# Patient Record
Sex: Male | Born: 1945
Health system: Southern US, Community
[De-identification: ages and names within clinical notes are randomized; demographics above are authoritative.]

## PROBLEM LIST (undated history)

## (undated) DIAGNOSIS — I10 Essential (primary) hypertension: Secondary | ICD-10-CM

## (undated) DIAGNOSIS — G4733 Obstructive sleep apnea (adult) (pediatric): Secondary | ICD-10-CM

## (undated) DIAGNOSIS — Z0000 Encounter for general adult medical examination without abnormal findings: Secondary | ICD-10-CM

## (undated) DIAGNOSIS — F339 Major depressive disorder, recurrent, unspecified: Secondary | ICD-10-CM

## (undated) DIAGNOSIS — E785 Hyperlipidemia, unspecified: Secondary | ICD-10-CM

## (undated) DIAGNOSIS — R079 Chest pain, unspecified: Secondary | ICD-10-CM

## (undated) DIAGNOSIS — L853 Xerosis cutis: Secondary | ICD-10-CM

## (undated) DIAGNOSIS — Z9989 Dependence on other enabling machines and devices: Secondary | ICD-10-CM

## (undated) DIAGNOSIS — K279 Peptic ulcer, site unspecified, unspecified as acute or chronic, without hemorrhage or perforation: Secondary | ICD-10-CM

## (undated) DIAGNOSIS — J309 Allergic rhinitis, unspecified: Secondary | ICD-10-CM

## (undated) DIAGNOSIS — I2510 Atherosclerotic heart disease of native coronary artery without angina pectoris: Secondary | ICD-10-CM

## (undated) DIAGNOSIS — Z6841 Body Mass Index (BMI) 40.0 and over, adult: Secondary | ICD-10-CM

## (undated) HISTORY — DX: Chest pain, unspecified: R07.9

## (undated) HISTORY — DX: Encounter for general adult medical examination without abnormal findings: Z00.00

## (undated) HISTORY — DX: Xerosis cutis: L85.3

## (undated) HISTORY — DX: Essential (primary) hypertension: I10

## (undated) HISTORY — DX: Peptic ulcer, site unspecified, unspecified as acute or chronic, without hemorrhage or perforation: K27.9

## (undated) HISTORY — DX: Dependence on other enabling machines and devices: Z99.89

## (undated) HISTORY — DX: Body Mass Index (BMI) 40.0 and over, adult: Z684

## (undated) HISTORY — DX: Allergic rhinitis, unspecified: J30.9

## (undated) HISTORY — DX: Obstructive sleep apnea (adult) (pediatric): G47.33

## (undated) HISTORY — DX: Major depressive disorder, recurrent, unspecified: F33.9

---

## 1999-01-27 ENCOUNTER — Ambulatory Visit (HOSPITAL_COMMUNITY): Admission: RE | Admit: 1999-01-27 | Discharge: 1999-01-27 | Payer: Self-pay | Admitting: Internal Medicine

## 2007-02-27 ENCOUNTER — Inpatient Hospital Stay (HOSPITAL_BASED_OUTPATIENT_CLINIC_OR_DEPARTMENT_OTHER): Admission: RE | Admit: 2007-02-27 | Discharge: 2007-02-27 | Payer: Self-pay | Admitting: Interventional Cardiology

## 2010-08-18 NOTE — Cardiovascular Report (Signed)
NAME:  Christopher Hernandez, ATTIG NO.:  [id number]   MEDICAL RECORD NO.:  [id number]          PATIENT TYPE:  OIB   LOCATION:  1966                         FACILITY:  [facility]   PHYSICIAN:  Corky Crafts, MDDATE OF BIRTH:  [birth year]   DATE OF PROCEDURE:  02/27/2007  DATE OF DISCHARGE:                            CARDIAC CATHETERIZATION   REFERRING PHYSICIAN:  Tally Joe, M.D.   PROCEDURES PERFORMED:  Left heart catheterization, left ventriculogram,  coronary angiogram, abdominal aortogram.   OPERATOR:  Dr. Eldridge Dace.   INDICATIONS:  Multiple risk factors for heart disease, including  diabetes, high cholesterol, high blood pressure in a patient who has  been having chest pain and an abnormal stress test.   PROCEDURE:  The risks and benefits of cardiac catheterization were  explained to the patient and informed consent was obtained.  The patient  was brought to the cath lab.  He was prepped and draped in the usual  sterile fashion.  His right groin was infiltrated with 1% lidocaine.  A  4-French arterial sheath was placed into the right femoral artery using  the modified Seldinger technique.  Left coronary artery angiography was  performed using a JL-4 catheter.  The catheter was advanced to the  vessel ostium under fluoroscopic guidance.  Digital angiography was  performed in multiple projections using hand injection of contrast.  Right coronary artery angiography was performed using a 3-D RC catheter.  Digital angiography was performed in multiple projections using hand  injection of contrast.  A  pigtail catheter was advanced to the  ascending aorta and across the aortic valve under fluoroscopic guidance.  Digital angiography was performed in the RAO projection using a power  injection of contrast.  The catheter was pulled back under continuous  hemodynamic pressure monitoring.  The catheter was then withdrawn to the  abdominal aorta, and power injection of contrast  was performed to image  the abdominal aorta.  The sheath was removed using manual compression.   FINDINGS:  The left main was a long large vessel which was widely  patent.  The left circumflex was large vessel with mild irregularities.  It gave rise to a large OM1 and a large OM2, both of which were widely  patent.  They both had mild irregularities.  There was a small ramus  branch  which was widely patent.  The left anterior descending was a  large vessel with a 25% mid-vessel stenosis.  There were mild  irregularities throughout the proximal and mid vessel.  There was a  large C1 with mild irregularities.  The right coronary artery was a  large dominant vessel with mild irregularities.  The right PDA was  medium-sized with mild irregularities.  The right posterolateral branch  was medium-sized and widely patent.  The left ventriculogram showed  normal ventricular function.  Left ventricular pressure 117/6 with an  LVEDP of 13 mmHg, aortic pressure of 117/69 with a mean aortic pressure  of 90 mmHg.  The abdominal aortogram showed no abdominal aortic  aneurysm.  There was a large SMA which was patent.  There appeared to be  single renal arteries bilaterally, both of which were widely patent   IMPRESSION:  1. Mild coronary atherosclerosis.  The patient's chest pain is likely      noncardiac in nature.  2. Normal hemodynamics.  3. Normal ventricular function.  4. No abdominal aortic aneurysm.   RECOMMENDATIONS:  The patient to continue aggressive risk factor  modification and preventive therapy.      Corky Crafts, MD  Electronically Signed     JSV/MEDQ  D:  02/27/2007  T:  02/27/2007  Job:  [id number]

## 2012-01-02 ENCOUNTER — Emergency Department (HOSPITAL_COMMUNITY)
Admission: EM | Admit: 2012-01-02 | Discharge: 2012-01-02 | Disposition: A | Discharge status: Home / Self Care | Payer: 59 | Source: Home / Self Care | Attending: Family Medicine | Admitting: Family Medicine

## 2012-01-02 ENCOUNTER — Encounter (HOSPITAL_COMMUNITY): Payer: Self-pay | Admitting: *Deleted

## 2012-01-02 DIAGNOSIS — L0291 Cutaneous abscess, unspecified: Secondary | ICD-10-CM

## 2012-01-02 DIAGNOSIS — L0390 Cellulitis, unspecified: Secondary | ICD-10-CM

## 2012-01-02 HISTORY — DX: Hyperlipidemia, unspecified: E78.5

## 2012-01-02 MED ORDER — CEPHALEXIN 500 MG PO CAPS: 500.0000 mg | ORAL_CAPSULE | Freq: Four times a day (QID) | ORAL | Status: DC

## 2012-01-02 NOTE — ED Provider Notes (Signed)
History     CSN: [id number]  Arrival date & time 01/02/12  4098   First MD Initiated Contact with Patient 01/02/12 1937      Chief Complaint  Patient presents with  . Cellulitis    (Consider location/radiation/quality/duration/timing/severity/associated sxs/prior treatment) HPI Comments: Pt thinks a mosquito bit him about a week ago on L forearm. Area has gradually gotten to be painful, warm, red, worse in last 2 days. Blood sugars are typically 80-125.   Patient is a 66 y.o. male presenting with extremity pain. The history is provided by the patient.  Extremity Pain This is a new problem. Episode onset: 1 week ago, worse in last 2 days. The problem occurs constantly. The problem has been gradually worsening. The symptoms are aggravated by bending. Nothing relieves the symptoms. He has tried nothing for the symptoms.    Past Medical History  Diagnosis Date  . Diabetes mellitus   . Hyperlipemia   . Depression     History reviewed. No pertinent past surgical history.  Family History  Problem Relation Age of Onset  . Family history unknown: Yes    History  Substance Use Topics  . Smoking status: Never Smoker   . Smokeless tobacco: Not on file  . Alcohol Use: No      Review of Systems  Constitutional: Negative for fever and chills.  Musculoskeletal: Negative for joint swelling.  Skin: Positive for color change.    Allergies  Review of patient's allergies indicates no known allergies.  Home Medications   Current Outpatient Rx  Name Route Sig Dispense Refill  . ASPIRIN 81 MG PO TABS Oral Take 81 mg by mouth daily.    Marland Kitchen LISINOPRIL 10 MG PO TABS Oral Take 10 mg by mouth daily.    Marland Kitchen METFORMIN HCL 1000 MG PO TABS Oral Take 1,000 mg by mouth 2 (two) times daily with a meal.    . PIOGLITAZONE HCL 15 MG PO TABS Oral Take 15 mg by mouth daily.    . SERTRALINE HCL 100 MG PO TABS Oral Take 100 mg by mouth daily.    Marland Kitchen SIMVASTATIN 40 MG PO TABS Oral Take 40 mg by mouth  every evening.    . CEPHALEXIN 500 MG PO CAPS Oral Take 1 capsule (500 mg total) by mouth 4 (four) times daily. 40 capsule 0    BP 146/90  Pulse 86  Temp 97.5 F (36.4 C) (Oral)  Resp 18  SpO2 94%  Physical Exam  Constitutional: He appears well-developed and well-nourished. No distress.  Pulmonary/Chest: Effort normal.  Musculoskeletal:       Left elbow: He exhibits normal range of motion, no swelling and no deformity. no tenderness found.  Skin: Skin is warm and dry. No rash noted. There is erythema.       ED Course  Procedures (including critical care time)  Labs Reviewed - No data to display No results found.   1. Cellulitis       MDM  Dr. Artis Flock examined pt with me and will see pt tomorrow for recheck.         Cathlyn Parsons, NP 01/02/12 1958

## 2012-01-02 NOTE — ED Notes (Signed)
pt treports redness and swelling to left elbow that has gotten worse over the past week

## 2012-01-03 ENCOUNTER — Encounter (HOSPITAL_COMMUNITY): Payer: Self-pay | Admitting: Emergency Medicine

## 2012-01-03 ENCOUNTER — Emergency Department (HOSPITAL_COMMUNITY)
Admission: EM | Admit: 2012-01-03 | Discharge: 2012-01-03 | Disposition: A | Discharge status: Home / Self Care | Payer: 59 | Source: Home / Self Care

## 2012-01-03 DIAGNOSIS — L0390 Cellulitis, unspecified: Secondary | ICD-10-CM

## 2012-01-03 DIAGNOSIS — L0291 Cutaneous abscess, unspecified: Secondary | ICD-10-CM

## 2012-01-03 NOTE — ED Provider Notes (Signed)
History     CSN: [id number]  Arrival date & time 01/03/12  1823   None     Chief Complaint  Patient presents with  . Follow-up    (Consider location/radiation/quality/duration/timing/severity/associated sxs/prior treatment) HPI Comments: C72-year-old man seen yesterday for cellulitis of the left arm returns today for wound check. Been taking his antibiotics as directed. He states his arm feels better to him, much less tight and not quite as red.   Past Medical History  Diagnosis Date  . Diabetes mellitus   . Hyperlipemia   . Depression     History reviewed. No pertinent past surgical history.  History reviewed. No pertinent family history.  History  Substance Use Topics  . Smoking status: Never Smoker   . Smokeless tobacco: Not on file  . Alcohol Use: No      Review of Systems  Constitutional: Negative.   Skin: Positive for color change.       Erythema to posterior upper arm. Elbow proximalmost forearm. There is edema however it is less than yesterday.    Allergies  Review of patient's allergies indicates no known allergies.  Home Medications   Current Outpatient Rx  Name Route Sig Dispense Refill  . ASPIRIN 81 MG PO TABS Oral Take 81 mg by mouth daily.    . CEPHALEXIN 500 MG PO CAPS Oral Take 1 capsule (500 mg total) by mouth 4 (four) times daily. 40 capsule 0  . LISINOPRIL 10 MG PO TABS Oral Take 10 mg by mouth daily.    Marland Kitchen METFORMIN HCL 1000 MG PO TABS Oral Take 1,000 mg by mouth 2 (two) times daily with a meal.    . PIOGLITAZONE HCL 15 MG PO TABS Oral Take 15 mg by mouth daily.    . SERTRALINE HCL 100 MG PO TABS Oral Take 100 mg by mouth daily.    Marland Kitchen SIMVASTATIN 40 MG PO TABS Oral Take 40 mg by mouth every evening.      BP 138/70  Pulse 72  Temp 98.2 F (36.8 C) (Oral)  Resp 18  SpO2 97%  Physical Exam  Constitutional: He is oriented to person, place, and time. He appears well-developed and well-nourished. No distress.  Neurological: He is alert  and oriented to person, place, and time.  Skin: Skin is warm and dry. There is erythema.       Erythema to posterior upper arm. Elbow proximalmost forearm. There is edema however it is less than yesterday.     ED Course  Procedures (including critical care time)  Labs Reviewed - No data to display No results found.   1. Cellulitis       MDM  Seen by Dr. Artis Flock. St is improved over yesterday. To continue the antibx. Return if worse.         Hayden Rasmussen, NP 01/03/12 2012

## 2012-01-03 NOTE — ED Provider Notes (Signed)
Medical screening examination/treatment/procedure(s) were performed by resident physician or non-physician practitioner and as supervising physician I was immediately available for consultation/collaboration.   Barkley Bruns MD.    Linna Hoff, MD 01/03/12 [phone]

## 2012-01-03 NOTE — ED Provider Notes (Signed)
Medical screening examination/treatment/procedure(s) were performed by resident physician or non-physician practitioner and as supervising physician I was immediately available for consultation/collaboration.   Barkley Bruns MD.    Linna Hoff, MD 01/03/12 2122

## 2012-01-03 NOTE — ED Notes (Addendum)
Follow-up left arm cellulitis. Still has some redness and swelling around elbow.  Pt states that it is feeling a lot better. pt denies pain.

## 2014-10-31 ENCOUNTER — Ambulatory Visit: Payer: Medicare HMO | Admitting: Interventional Cardiology

## 2015-01-10 DIAGNOSIS — G4733 Obstructive sleep apnea (adult) (pediatric): Secondary | ICD-10-CM | POA: Diagnosis not present

## 2015-02-10 DIAGNOSIS — G4733 Obstructive sleep apnea (adult) (pediatric): Secondary | ICD-10-CM | POA: Diagnosis not present

## 2015-03-12 DIAGNOSIS — G4733 Obstructive sleep apnea (adult) (pediatric): Secondary | ICD-10-CM | POA: Diagnosis not present

## 2015-04-12 DIAGNOSIS — G4733 Obstructive sleep apnea (adult) (pediatric): Secondary | ICD-10-CM | POA: Diagnosis not present

## 2015-04-17 DIAGNOSIS — G4733 Obstructive sleep apnea (adult) (pediatric): Secondary | ICD-10-CM | POA: Diagnosis not present

## 2015-05-12 DIAGNOSIS — E119 Type 2 diabetes mellitus without complications: Secondary | ICD-10-CM | POA: Diagnosis not present

## 2015-05-12 DIAGNOSIS — J309 Allergic rhinitis, unspecified: Secondary | ICD-10-CM | POA: Diagnosis not present

## 2015-05-12 DIAGNOSIS — Z7984 Long term (current) use of oral hypoglycemic drugs: Secondary | ICD-10-CM | POA: Diagnosis not present

## 2015-05-12 DIAGNOSIS — E782 Mixed hyperlipidemia: Secondary | ICD-10-CM | POA: Diagnosis not present

## 2015-05-12 DIAGNOSIS — G4730 Sleep apnea, unspecified: Secondary | ICD-10-CM | POA: Diagnosis not present

## 2015-05-12 DIAGNOSIS — K029 Dental caries, unspecified: Secondary | ICD-10-CM | POA: Diagnosis not present

## 2015-05-12 DIAGNOSIS — R69 Illness, unspecified: Secondary | ICD-10-CM | POA: Diagnosis not present

## 2015-05-12 DIAGNOSIS — Z6841 Body Mass Index (BMI) 40.0 and over, adult: Secondary | ICD-10-CM | POA: Diagnosis not present

## 2015-05-13 DIAGNOSIS — G4733 Obstructive sleep apnea (adult) (pediatric): Secondary | ICD-10-CM | POA: Diagnosis not present

## 2015-05-16 DIAGNOSIS — Z1211 Encounter for screening for malignant neoplasm of colon: Secondary | ICD-10-CM | POA: Diagnosis not present

## 2015-06-10 DIAGNOSIS — G4733 Obstructive sleep apnea (adult) (pediatric): Secondary | ICD-10-CM | POA: Diagnosis not present

## 2015-06-17 DIAGNOSIS — J069 Acute upper respiratory infection, unspecified: Secondary | ICD-10-CM | POA: Diagnosis not present

## 2015-07-11 DIAGNOSIS — G4733 Obstructive sleep apnea (adult) (pediatric): Secondary | ICD-10-CM | POA: Diagnosis not present

## 2015-08-10 DIAGNOSIS — G4733 Obstructive sleep apnea (adult) (pediatric): Secondary | ICD-10-CM | POA: Diagnosis not present

## 2015-09-03 DIAGNOSIS — G4733 Obstructive sleep apnea (adult) (pediatric): Secondary | ICD-10-CM | POA: Diagnosis not present

## 2015-09-10 DIAGNOSIS — G4733 Obstructive sleep apnea (adult) (pediatric): Secondary | ICD-10-CM | POA: Diagnosis not present

## 2015-10-10 DIAGNOSIS — G4733 Obstructive sleep apnea (adult) (pediatric): Secondary | ICD-10-CM | POA: Diagnosis not present

## 2015-11-06 DIAGNOSIS — J0100 Acute maxillary sinusitis, unspecified: Secondary | ICD-10-CM | POA: Diagnosis not present

## 2015-11-10 DIAGNOSIS — G4733 Obstructive sleep apnea (adult) (pediatric): Secondary | ICD-10-CM | POA: Diagnosis not present

## 2015-12-02 DIAGNOSIS — Z125 Encounter for screening for malignant neoplasm of prostate: Secondary | ICD-10-CM | POA: Diagnosis not present

## 2015-12-02 DIAGNOSIS — R69 Illness, unspecified: Secondary | ICD-10-CM | POA: Diagnosis not present

## 2015-12-02 DIAGNOSIS — Z0000 Encounter for general adult medical examination without abnormal findings: Secondary | ICD-10-CM | POA: Diagnosis not present

## 2015-12-02 DIAGNOSIS — J309 Allergic rhinitis, unspecified: Secondary | ICD-10-CM | POA: Diagnosis not present

## 2015-12-02 DIAGNOSIS — Z1211 Encounter for screening for malignant neoplasm of colon: Secondary | ICD-10-CM | POA: Diagnosis not present

## 2015-12-02 DIAGNOSIS — E119 Type 2 diabetes mellitus without complications: Secondary | ICD-10-CM | POA: Diagnosis not present

## 2015-12-02 DIAGNOSIS — E782 Mixed hyperlipidemia: Secondary | ICD-10-CM | POA: Diagnosis not present

## 2015-12-02 DIAGNOSIS — Z1389 Encounter for screening for other disorder: Secondary | ICD-10-CM | POA: Diagnosis not present

## 2015-12-02 DIAGNOSIS — G4730 Sleep apnea, unspecified: Secondary | ICD-10-CM | POA: Diagnosis not present

## 2015-12-15 DIAGNOSIS — J209 Acute bronchitis, unspecified: Secondary | ICD-10-CM | POA: Diagnosis not present

## 2016-03-17 DIAGNOSIS — E119 Type 2 diabetes mellitus without complications: Secondary | ICD-10-CM | POA: Diagnosis not present

## 2016-03-17 DIAGNOSIS — E7800 Pure hypercholesterolemia, unspecified: Secondary | ICD-10-CM | POA: Diagnosis not present

## 2016-03-17 DIAGNOSIS — I10 Essential (primary) hypertension: Secondary | ICD-10-CM | POA: Diagnosis not present

## 2016-03-17 DIAGNOSIS — R69 Illness, unspecified: Secondary | ICD-10-CM | POA: Diagnosis not present

## 2016-03-17 DIAGNOSIS — Z6839 Body mass index (BMI) 39.0-39.9, adult: Secondary | ICD-10-CM | POA: Diagnosis not present

## 2016-03-17 DIAGNOSIS — Z0000 Encounter for general adult medical examination without abnormal findings: Secondary | ICD-10-CM | POA: Diagnosis not present

## 2016-06-04 DIAGNOSIS — Z7984 Long term (current) use of oral hypoglycemic drugs: Secondary | ICD-10-CM | POA: Diagnosis not present

## 2016-06-04 DIAGNOSIS — E1165 Type 2 diabetes mellitus with hyperglycemia: Secondary | ICD-10-CM | POA: Diagnosis not present

## 2016-06-04 DIAGNOSIS — J309 Allergic rhinitis, unspecified: Secondary | ICD-10-CM | POA: Diagnosis not present

## 2016-06-04 DIAGNOSIS — E782 Mixed hyperlipidemia: Secondary | ICD-10-CM | POA: Diagnosis not present

## 2016-06-04 DIAGNOSIS — G4730 Sleep apnea, unspecified: Secondary | ICD-10-CM | POA: Diagnosis not present

## 2016-06-04 DIAGNOSIS — R69 Illness, unspecified: Secondary | ICD-10-CM | POA: Diagnosis not present

## 2016-11-24 DIAGNOSIS — G4733 Obstructive sleep apnea (adult) (pediatric): Secondary | ICD-10-CM | POA: Diagnosis not present

## 2016-12-27 DIAGNOSIS — G4733 Obstructive sleep apnea (adult) (pediatric): Secondary | ICD-10-CM | POA: Diagnosis not present

## 2016-12-27 DIAGNOSIS — H5203 Hypermetropia, bilateral: Secondary | ICD-10-CM | POA: Diagnosis not present

## 2017-02-03 DIAGNOSIS — E1165 Type 2 diabetes mellitus with hyperglycemia: Secondary | ICD-10-CM | POA: Diagnosis not present

## 2017-02-03 DIAGNOSIS — L853 Xerosis cutis: Secondary | ICD-10-CM | POA: Diagnosis not present

## 2017-02-03 DIAGNOSIS — E782 Mixed hyperlipidemia: Secondary | ICD-10-CM | POA: Diagnosis not present

## 2017-02-03 DIAGNOSIS — R69 Illness, unspecified: Secondary | ICD-10-CM | POA: Diagnosis not present

## 2017-02-03 DIAGNOSIS — J309 Allergic rhinitis, unspecified: Secondary | ICD-10-CM | POA: Diagnosis not present

## 2017-02-03 DIAGNOSIS — Z0000 Encounter for general adult medical examination without abnormal findings: Secondary | ICD-10-CM | POA: Diagnosis not present

## 2017-02-03 DIAGNOSIS — Z7984 Long term (current) use of oral hypoglycemic drugs: Secondary | ICD-10-CM | POA: Diagnosis not present

## 2017-02-03 DIAGNOSIS — R079 Chest pain, unspecified: Secondary | ICD-10-CM | POA: Diagnosis not present

## 2017-02-03 DIAGNOSIS — G4730 Sleep apnea, unspecified: Secondary | ICD-10-CM | POA: Diagnosis not present

## 2017-02-28 DIAGNOSIS — E119 Type 2 diabetes mellitus without complications: Secondary | ICD-10-CM | POA: Diagnosis not present

## 2017-03-16 ENCOUNTER — Encounter: Payer: Self-pay | Admitting: *Deleted

## 2017-03-21 ENCOUNTER — Encounter: Payer: Self-pay | Admitting: Interventional Cardiology

## 2017-03-21 ENCOUNTER — Ambulatory Visit: Payer: Medicare HMO | Admitting: Interventional Cardiology

## 2017-03-21 VITALS — BP 110/74 | HR 79 | Ht 69.0 in | Wt 277.6 lb

## 2017-03-21 DIAGNOSIS — I209 Angina pectoris, unspecified: Secondary | ICD-10-CM

## 2017-03-21 DIAGNOSIS — E118 Type 2 diabetes mellitus with unspecified complications: Secondary | ICD-10-CM

## 2017-03-21 DIAGNOSIS — E785 Hyperlipidemia, unspecified: Secondary | ICD-10-CM

## 2017-03-21 DIAGNOSIS — R0609 Other forms of dyspnea: Secondary | ICD-10-CM

## 2017-03-21 DIAGNOSIS — R0600 Dyspnea, unspecified: Secondary | ICD-10-CM

## 2017-03-21 LAB — BASIC METABOLIC PANEL
BUN/Creatinine Ratio: 18 (ref 10–24)
BUN: 17 mg/dL (ref 8–27)
CALCIUM: 9.3 mg/dL (ref 8.6–10.2)
CO2: 24 mmol/L (ref 20–29)
CREATININE: 0.95 mg/dL (ref 0.76–1.27)
Chloride: 104 mmol/L (ref 96–106)
GFR calc Af Amer: 93 mL/min/{1.73_m2} (ref >59)
GFR, EST NON AFRICAN AMERICAN: 80 mL/min/{1.73_m2} (ref >59)
Glucose: 125 mg/dL — ABNORMAL HIGH (ref 65–99)
Potassium: 4.7 mmol/L (ref 3.5–5.2)
Sodium: 141 mmol/L (ref 134–144)

## 2017-03-21 LAB — CBC
HEMOGLOBIN: 13.7 g/dL (ref 13.0–17.7)
Hematocrit: 41.3 % (ref 37.5–51.0)
MCH: 30.2 pg (ref 26.6–33.0)
MCHC: 33.2 g/dL (ref 31.5–35.7)
MCV: 91 fL (ref 79–97)
Platelets: 277 10*3/uL (ref 150–379)
RBC: 4.54 x10E6/uL (ref 4.14–5.80)
RDW: 13.2 % (ref 12.3–15.4)
WBC: 6.8 10*3/uL (ref 3.4–10.8)

## 2017-03-21 LAB — PROTIME-INR
INR: 1 (ref 0.8–1.2)
Prothrombin Time: 10.4 s (ref 9.1–12.0)

## 2017-03-21 NOTE — Progress Notes (Signed)
Cardiology Office Note   Date:  03/21/2017   ID:  GRIFFEY NICASIO, DOB November 02, 1945, MRN [id number]  PCP:  [name], MD    No chief complaint on file. CAD   Wt Readings from Last 3 Encounters:  03/21/17 277 lb 9.6 oz (125.9 kg)       History of Present Illness: Christopher Hernandez is a 71 y.o. male  Who has had RF for CAD.  He reports some DOE, fatigue and tightness in his chest.  Triggers for these include activity, particularly going up stairs.  Emotional stress can also play a part.  Rest improves the sx.  He wants to sleep all of the time.  He uses CPAP.  No regular exercise.      Past Medical History:  Diagnosis Date  . BMI 40.0-44.9, adult ([place])   . Chest pain at rest   . Diabetes mellitus   . Hyperlipemia   . Hypertension   . OSA on CPAP   . Peptic ulcer disease   . Recurrent major depressive disorder ([place])   . Rhinitis, allergic   . Routine general medical examination at a health care facility   . Xerosis of skin     History reviewed. No pertinent surgical history.   Current Outpatient Medications  Medication Sig Dispense Refill  . aspirin 81 MG tablet Take 81 mg by mouth daily.    . Beclomethasone Dipropionate (QNASL) 80 MCG/ACT AERS Place 2 puffs into both nostrils daily.    . fluticasone (FLONASE) 50 MCG/ACT nasal spray Place into both nostrils daily.    Marland Kitchen lisinopril (PRINIVIL,ZESTRIL) 10 MG tablet Take 10 mg by mouth daily.    . metFORMIN (GLUCOPHAGE) 1000 MG tablet Take 1,000 mg by mouth 2 (two) times daily with a meal.    . pioglitazone (ACTOS) 15 MG tablet Take 15 mg by mouth daily.    . sertraline (ZOLOFT) 100 MG tablet Take 100 mg by mouth daily.    . simvastatin (ZOCOR) 40 MG tablet Take 40 mg by mouth every evening.     No current facility-administered medications for this visit.     Allergies:   Patient has no known allergies.    Social History:  The patient  reports that  has never smoked. he has never used smokeless tobacco. He  reports that he does not drink alcohol or use drugs.   Family History:  The patient's family history includes Cirrhosis in his father; Diabetes Mellitus I in his mother; Hypertension in his mother; Liver disease in his father.    ROS:  Please see the history of present illness.   Otherwise, review of systems are positive for DOE.   All other systems are reviewed and negative.    PHYSICAL EXAM: VS:  BP 110/74   Pulse 79   Ht 5\' 9"  (1.753 m)   Wt 277 lb 9.6 oz (125.9 kg)   SpO2 95%   BMI 40.99 kg/m  , BMI Body mass index is 40.99 kg/m. GEN: Well nourished, well developed, in no acute distress  HEENT: normal  Neck: no JVD, carotid bruits, or masses Cardiac: RRR; no murmurs, rubs, or gallops,no edema  Respiratory:  clear to auscultation bilaterally, normal work of breathing GI: soft, nontender, nondistended, + BS, obese MS: no deformity or atrophy  Skin: warm and dry, no rash Neuro:  Strength and sensation are intact Psych: euthymic mood, full affect   EKG:   The ekg ordered today demonstrates NSR, RBBB, no  ST changes   Recent Labs: No results found for requested labs within last 8760 hours.   Lipid Panel No results found for: CHOL, TRIG, HDL, CHOLHDL, VLDL, LDLCALC, LDLDIRECT   Other studies Reviewed: Additional studies/ records that were reviewed today with results demonstrating: Normal creatinine and November 2018.  LDL 62 in November 2018, HDL 37, A1c 6.8, normal ALT.   ASSESSMENT AND PLAN:  1. Exertional chest discomfort: Symptoms worrisome for angina.  Multiple risk factors for coronary artery disease.  Risks and benefits of cardiac cath discussed with patient and he is agreeable to proceed.  Last caht in 2008 showed mild disease.  2+ right radial pulse. 2. Dyspnea on exertion: May be multifactorial.  He is overweight and is not exercising regularly.  Could be an anginal equivalent.  I do not think stress testing will be useful as he had an abnormal stress test plan  for As noted above. 3. Diabetes: Managed by primary care. 4. Hyperlipidemia: LDL needs to be less than 100.  Continue lipid-lowering therapy.   Current medicines are reviewed at length with the patient today.  The patient concerns regarding his medicines were addressed.  The following changes have been made:  No change  Labs/ tests ordered today include:  No orders of the defined types were placed in this encounter.   Recommend 150 minutes/week of aerobic exercise Low fat, low carb, high fiber diet recommended  Disposition:   FU for cath   Signed, Larae Grooms, MD  03/21/2017 9:41 AM    [facility] [place], [place], [place]  [address] Phone: [phone]; Fax: [phone]

## 2017-03-21 NOTE — Patient Instructions (Addendum)
Medication Instructions:  Your physician recommends that you continue on your current medications as directed. Please refer to the Current Medication list given to you today.   Labwork: TODAY: CBC, BMET, PT/INR  Testing/Procedures: Your physician has requested that you have a cardiac catheterization on 03/24/17. Cardiac catheterization is used to diagnose and/or treat various heart conditions. Doctors may recommend this procedure for a number of different reasons. The most common reason is to evaluate chest pain. Chest pain can be a symptom of coronary artery disease (CAD), and cardiac catheterization can show whether plaque is narrowing or blocking your heart's arteries. This procedure is also used to evaluate the valves, as well as measure the blood flow and oxygen levels in different parts of your heart. For further information please visit [email or web address]. Please follow instruction sheet, as given.  Follow-Up: Your physician recommends that you schedule a follow-up appointment 2 weeks after heart catheterization with Dr. Irish Lack or APP on his team.    Any Other Special Instructions Will Be Listed Below (If Applicable).    [place] OFFICE [address], [place] [address] Dept: [phone] Loc: [phone]  Christopher Hernandez  03/21/2017  You are scheduled for a Cardiac Catheterization on Thursday, December 20 with Dr. Larae Grooms.  1. Please arrive at the [facility] (Main Entrance A) at [facility]: [address] [place], [place] [address] at 8:00 AM (two hours before your procedure to ensure your preparation). Free valet parking service is available.   Special note: Every effort is made to have your procedure done on time. Please understand that emergencies sometimes delay scheduled procedures.  2. Diet: Do not eat or drink anything after midnight prior to your  procedure except sips of water to take medications.  3. Labs: LABS TODAY  4. Medication instructions in preparation for your procedure:  DO NOT take your Metformin 24 hours prior to procedure and for 48 hours after procedure.   On the morning of your procedure, take your Aspirin and any morning medicines NOT listed above.  You may use sips of water.  5. Plan for one night stay--bring personal belongings. 6. Bring a current list of your medications and current insurance cards. 7. You MUST have a responsible person to drive you home. 8. Someone MUST be with you the first 24 hours after you arrive home or your discharge will be delayed. 9. Please wear clothes that are easy to get on and off and wear slip-on shoes.  Thank you for allowing [place] to care for you!   -- [facility]    If you need a refill on your cardiac medications before your next appointment, please call your pharmacy.   Coronary Angiogram A coronary angiogram is an X-ray procedure that is used to examine the arteries in the heart. In this procedure, a dye (contrast dye) is injected through a long, thin tube (catheter). The catheter is inserted through the groin, wrist, or arm. The dye is injected into each artery, then X-rays are taken to show if there is a blockage in the arteries of the heart. This procedure can also show if you have valve disease or a disease of the aorta, and it can be used to check the overall function of your heart muscle. You may have a coronary angiogram if:  You are having chest pain, or other symptoms of angina, and you are at risk for heart  disease.  You have an abnormal electrocardiogram (ECG) or stress test.  You have chest pain and heart failure.  You are having irregular heart rhythms.  You and your health care provider determine that the benefits of the test information outweigh the risks of the procedure.  Let your health care provider know about:  Any  allergies you have, including allergies to contrast dye.  All medicines you are taking, including vitamins, herbs, eye drops, creams, and over-the-counter medicines.  Any problems you or family members have had with anesthetic medicines.  Any blood disorders you have.  Any surgeries you have had.  History of kidney problems or kidney failure.  Any medical conditions you have.  Whether you are pregnant or may be pregnant. What are the risks? Generally, this is a safe procedure. However, problems may occur, including:  Infection.  Allergic reaction to medicines or dyes that are used.  Bleeding from the access site or other locations.  Kidney injury, especially in people with impaired kidney function.  Stroke (rare).  Heart attack (rare).  Damage to other structures or organs.  What happens before the procedure? Staying hydrated Follow instructions from your health care provider about hydration, which may include:  Up to 2 hours before the procedure - you may continue to drink clear liquids, such as water, clear fruit juice, black coffee, and plain tea.  Eating and drinking restrictions Follow instructions from your health care provider about eating and drinking, which may include:  8 hours before the procedure - stop eating heavy meals or foods such as meat, fried foods, or fatty foods.  6 hours before the procedure - stop eating light meals or foods, such as toast or cereal.  2 hours before the procedure - stop drinking clear liquids.  General instructions  Ask your health care provider about: ? Changing or stopping your regular medicines. This is especially important if you are taking diabetes medicines or blood thinners. ? Taking medicines such as ibuprofen. These medicines can thin your blood. Do not take these medicines before your procedure if your health care provider instructs you not to, though aspirin may be recommended prior to coronary angiograms.  Plan  to have someone take you home from the hospital or clinic.  You may need to have blood tests or X-rays done. What happens during the procedure?  An IV tube will be inserted into one of your veins.  You will be given one or more of the following: ? A medicine to help you relax (sedative). ? A medicine to numb the area where the catheter will be inserted into an artery (local anesthetic).  To reduce your risk of infection: ? Your health care team will wash or sanitize their hands. ? Your skin will be washed with soap. ? Hair may be removed from the area where the catheter will be inserted.  You will be connected to a continuous ECG monitor.  The catheter will be inserted into an artery. The location may be in your groin, in your wrist, or in the fold of your arm (near your elbow).  A type of X-ray (fluoroscopy) will be used to help guide the catheter to the opening of the blood vessel that is being examined.  A dye will be injected into the catheter, and X-rays will be taken. The dye will help to show where any narrowing or blockages are located in the heart arteries.  Tell your health care provider if you have any chest pain or trouble  breathing during the procedure.  If blockages are found, your health care provider may perform another procedure, such as inserting a coronary stent. The procedure may vary among health care providers and hospitals. What happens after the procedure?  After the procedure, you will need to keep the area still for a few hours, or for as long as told by your health care provider. If the procedure is done through the groin, you will be instructed to not bend and not cross your legs.  The insertion site will be checked frequently.  The pulse in your foot or wrist will be checked frequently.  You may have additional blood tests, X-rays, and a test that records the electrical activity of your heart (ECG).  Do not drive for 24 hours if you were given a  sedative. Summary  A coronary angiogram is an X-ray procedure that is used to look into the arteries in the heart.  During the procedure, a dye (contrast dye) is injected through a long, thin tube (catheter). The catheter is inserted through the groin, wrist, or arm.  Tell your health care provider about any allergies you have, including allergies to contrast dye.  After the procedure, you will need to keep the area still for a few hours, or for as long as told by your health care provider. This information is not intended to replace advice given to you by your health care provider. Make sure you discuss any questions you have with your health care provider. Document Released: 09/26/2002 Document Revised: 01/02/2016 Document Reviewed: 01/02/2016 Elsevier Interactive Patient Education  [employer].

## 2017-03-21 NOTE — H&P (View-Only) (Signed)
Cardiology Office Note   Date:  03/21/2017   ID:  Christopher Hernandez, DOB October 06, 1945, MRN [id number]  PCP:  [name], MD    No chief complaint on file. CAD   Wt Readings from Last 3 Encounters:  03/21/17 277 lb 9.6 oz (125.9 kg)       History of Present Illness: Christopher Hernandez is a 71 y.o. male  Who has had RF for CAD.  He reports some DOE, fatigue and tightness in his chest.  Triggers for these include activity, particularly going up stairs.  Emotional stress can also play a part.  Rest improves the sx.  He wants to sleep all of the time.  He uses CPAP.  No regular exercise.      Past Medical History:  Diagnosis Date  . BMI 40.0-44.9, adult ([place])   . Chest pain at rest   . Diabetes mellitus   . Hyperlipemia   . Hypertension   . OSA on CPAP   . Peptic ulcer disease   . Recurrent major depressive disorder ([place])   . Rhinitis, allergic   . Routine general medical examination at a health care facility   . Xerosis of skin     History reviewed. No pertinent surgical history.   Current Outpatient Medications  Medication Sig Dispense Refill  . aspirin 81 MG tablet Take 81 mg by mouth daily.    . Beclomethasone Dipropionate (QNASL) 80 MCG/ACT AERS Place 2 puffs into both nostrils daily.    . fluticasone (FLONASE) 50 MCG/ACT nasal spray Place into both nostrils daily.    Marland Kitchen lisinopril (PRINIVIL,ZESTRIL) 10 MG tablet Take 10 mg by mouth daily.    . metFORMIN (GLUCOPHAGE) 1000 MG tablet Take 1,000 mg by mouth 2 (two) times daily with a meal.    . pioglitazone (ACTOS) 15 MG tablet Take 15 mg by mouth daily.    . sertraline (ZOLOFT) 100 MG tablet Take 100 mg by mouth daily.    . simvastatin (ZOCOR) 40 MG tablet Take 40 mg by mouth every evening.     No current facility-administered medications for this visit.     Allergies:   Patient has no known allergies.    Social History:  The patient  reports that  has never smoked. he has never used smokeless tobacco. He  reports that he does not drink alcohol or use drugs.   Family History:  The patient's family history includes Cirrhosis in his father; Diabetes Mellitus I in his mother; Hypertension in his mother; Liver disease in his father.    ROS:  Please see the history of present illness.   Otherwise, review of systems are positive for DOE.   All other systems are reviewed and negative.    PHYSICAL EXAM: VS:  BP 110/74   Pulse 79   Ht 5\' 9"  (1.753 m)   Wt 277 lb 9.6 oz (125.9 kg)   SpO2 95%   BMI 40.99 kg/m  , BMI Body mass index is 40.99 kg/m. GEN: Well nourished, well developed, in no acute distress  HEENT: normal  Neck: no JVD, carotid bruits, or masses Cardiac: RRR; no murmurs, rubs, or gallops,no edema  Respiratory:  clear to auscultation bilaterally, normal work of breathing GI: soft, nontender, nondistended, + BS, obese MS: no deformity or atrophy  Skin: warm and dry, no rash Neuro:  Strength and sensation are intact Psych: euthymic mood, full affect   EKG:   The ekg ordered today demonstrates NSR, RBBB, no  ST changes   Recent Labs: No results found for requested labs within last 8760 hours.   Lipid Panel No results found for: CHOL, TRIG, HDL, CHOLHDL, VLDL, LDLCALC, LDLDIRECT   Other studies Reviewed: Additional studies/ records that were reviewed today with results demonstrating: Normal creatinine and November 2018.  LDL 62 in November 2018, HDL 37, A1c 6.8, normal ALT.   ASSESSMENT AND PLAN:  1. Exertional chest discomfort: Symptoms worrisome for angina.  Multiple risk factors for coronary artery disease.  Risks and benefits of cardiac cath discussed with patient and he is agreeable to proceed.  Last caht in 2008 showed mild disease.  2+ right radial pulse. 2. Dyspnea on exertion: May be multifactorial.  He is overweight and is not exercising regularly.  Could be an anginal equivalent.  I do not think stress testing will be useful as he had an abnormal stress test plan  for As noted above. 3. Diabetes: Managed by primary care. 4. Hyperlipidemia: LDL needs to be less than 100.  Continue lipid-lowering therapy.   Current medicines are reviewed at length with the patient today.  The patient concerns regarding his medicines were addressed.  The following changes have been made:  No change  Labs/ tests ordered today include:  No orders of the defined types were placed in this encounter.   Recommend 150 minutes/week of aerobic exercise Low fat, low carb, high fiber diet recommended  Disposition:   FU for cath   Signed, Larae Grooms, MD  03/21/2017 9:41 AM    [facility] [place], [place], [place]  [address] Phone: [phone]; Fax: [phone]

## 2017-03-23 ENCOUNTER — Telehealth: Payer: Self-pay

## 2017-03-23 NOTE — Telephone Encounter (Signed)
Left message without identifying Pt d/t no DPR.  Following instruction given:  Patient contacted pre-catheterization at [facility] scheduled for:  03/24/2017 @ 1030 Verified arrival time and place:  NT @ 0800 Confirmed AM meds to be taken pre-cath with sip of water: Hold metformin Wednesday-to 48 hours after Hold Actos Take ASA Left this nurse name and # if any questions

## 2017-03-24 ENCOUNTER — Encounter (HOSPITAL_COMMUNITY): Payer: Self-pay | Admitting: *Deleted

## 2017-03-24 ENCOUNTER — Ambulatory Visit (HOSPITAL_COMMUNITY)
Admission: RE | Admit: 2017-03-24 | Discharge: 2017-03-24 | Disposition: A | Discharge status: Home / Self Care | Payer: Medicare HMO | Source: Ambulatory Visit | Attending: Interventional Cardiology | Admitting: Interventional Cardiology

## 2017-03-24 ENCOUNTER — Encounter (HOSPITAL_COMMUNITY)
Admission: RE | Disposition: A | Discharge status: Home / Self Care | Payer: Self-pay | Source: Ambulatory Visit | Attending: Interventional Cardiology

## 2017-03-24 DIAGNOSIS — Z833 Family history of diabetes mellitus: Secondary | ICD-10-CM | POA: Insufficient documentation

## 2017-03-24 DIAGNOSIS — J309 Allergic rhinitis, unspecified: Secondary | ICD-10-CM | POA: Diagnosis not present

## 2017-03-24 DIAGNOSIS — E663 Overweight: Secondary | ICD-10-CM | POA: Diagnosis not present

## 2017-03-24 DIAGNOSIS — Z8249 Family history of ischemic heart disease and other diseases of the circulatory system: Secondary | ICD-10-CM | POA: Diagnosis not present

## 2017-03-24 DIAGNOSIS — R0600 Dyspnea, unspecified: Secondary | ICD-10-CM | POA: Diagnosis present

## 2017-03-24 DIAGNOSIS — G4733 Obstructive sleep apnea (adult) (pediatric): Secondary | ICD-10-CM | POA: Insufficient documentation

## 2017-03-24 DIAGNOSIS — Z7984 Long term (current) use of oral hypoglycemic drugs: Secondary | ICD-10-CM | POA: Insufficient documentation

## 2017-03-24 DIAGNOSIS — L853 Xerosis cutis: Secondary | ICD-10-CM | POA: Diagnosis not present

## 2017-03-24 DIAGNOSIS — Z79899 Other long term (current) drug therapy: Secondary | ICD-10-CM | POA: Diagnosis not present

## 2017-03-24 DIAGNOSIS — Z8379 Family history of other diseases of the digestive system: Secondary | ICD-10-CM | POA: Insufficient documentation

## 2017-03-24 DIAGNOSIS — I2510 Atherosclerotic heart disease of native coronary artery without angina pectoris: Secondary | ICD-10-CM | POA: Diagnosis not present

## 2017-03-24 DIAGNOSIS — K279 Peptic ulcer, site unspecified, unspecified as acute or chronic, without hemorrhage or perforation: Secondary | ICD-10-CM | POA: Diagnosis not present

## 2017-03-24 DIAGNOSIS — R69 Illness, unspecified: Secondary | ICD-10-CM | POA: Diagnosis not present

## 2017-03-24 DIAGNOSIS — E785 Hyperlipidemia, unspecified: Secondary | ICD-10-CM | POA: Diagnosis not present

## 2017-03-24 DIAGNOSIS — Z6841 Body Mass Index (BMI) 40.0 and over, adult: Secondary | ICD-10-CM | POA: Diagnosis not present

## 2017-03-24 DIAGNOSIS — R072 Precordial pain: Secondary | ICD-10-CM | POA: Diagnosis not present

## 2017-03-24 DIAGNOSIS — E119 Type 2 diabetes mellitus without complications: Secondary | ICD-10-CM | POA: Insufficient documentation

## 2017-03-24 DIAGNOSIS — F329 Major depressive disorder, single episode, unspecified: Secondary | ICD-10-CM | POA: Diagnosis not present

## 2017-03-24 DIAGNOSIS — I10 Essential (primary) hypertension: Secondary | ICD-10-CM | POA: Diagnosis not present

## 2017-03-24 DIAGNOSIS — Z7982 Long term (current) use of aspirin: Secondary | ICD-10-CM | POA: Insufficient documentation

## 2017-03-24 HISTORY — PX: LEFT HEART CATH AND CORONARY ANGIOGRAPHY: CATH118249

## 2017-03-24 LAB — GLUCOSE, CAPILLARY: Glucose-Capillary: 146 mg/dL — ABNORMAL HIGH (ref 65–99)

## 2017-03-24 SURGERY — LEFT HEART CATH AND CORONARY ANGIOGRAPHY
Anesthesia: LOCAL

## 2017-03-24 MED ORDER — SODIUM CHLORIDE 0.9% FLUSH: 3.0000 mL | Freq: Two times a day (BID) | INTRAVENOUS | Status: DC

## 2017-03-24 MED ORDER — SODIUM CHLORIDE 0.9 % IV SOLN: INTRAVENOUS | Status: AC

## 2017-03-24 MED ORDER — MIDAZOLAM HCL 2 MG/2ML IJ SOLN
INTRAMUSCULAR | Status: DC | PRN
  Administered 2017-03-24: 2 mg via INTRAVENOUS

## 2017-03-24 MED ORDER — HEPARIN SODIUM (PORCINE) 1000 UNIT/ML IJ SOLN
INTRAMUSCULAR | Status: AC
  Filled 2017-03-24: qty 1

## 2017-03-24 MED ORDER — HEPARIN (PORCINE) IN NACL 2-0.9 UNIT/ML-% IJ SOLN
INTRAMUSCULAR | Status: AC
  Filled 2017-03-24: qty 1000

## 2017-03-24 MED ORDER — VERAPAMIL HCL 2.5 MG/ML IV SOLN
INTRAVENOUS | Status: AC
  Filled 2017-03-24: qty 2

## 2017-03-24 MED ORDER — SODIUM CHLORIDE 0.9 % WEIGHT BASED INFUSION: 1.0000 mL/kg/h | INTRAVENOUS | Status: DC

## 2017-03-24 MED ORDER — SODIUM CHLORIDE 0.9% FLUSH: 3.0000 mL | INTRAVENOUS | Status: DC | PRN

## 2017-03-24 MED ORDER — LIDOCAINE HCL (PF) 1 % IJ SOLN
INTRAMUSCULAR | Status: AC
  Filled 2017-03-24: qty 30

## 2017-03-24 MED ORDER — MIDAZOLAM HCL 2 MG/2ML IJ SOLN
INTRAMUSCULAR | Status: AC
  Filled 2017-03-24: qty 2

## 2017-03-24 MED ORDER — IOPAMIDOL (ISOVUE-370) INJECTION 76%
INTRAVENOUS | Status: AC
  Filled 2017-03-24: qty 100

## 2017-03-24 MED ORDER — FENTANYL CITRATE (PF) 100 MCG/2ML IJ SOLN
INTRAMUSCULAR | Status: DC | PRN
  Administered 2017-03-24: 25 ug via INTRAVENOUS

## 2017-03-24 MED ORDER — HEPARIN SODIUM (PORCINE) 1000 UNIT/ML IJ SOLN
INTRAMUSCULAR | Status: DC | PRN
  Administered 2017-03-24: 6000 [IU] via INTRAVENOUS

## 2017-03-24 MED ORDER — SODIUM CHLORIDE 0.9 % IV SOLN
250.0000 mL | INTRAVENOUS | Status: DC | PRN
Start: 2017-03-24 — End: 2017-03-24

## 2017-03-24 MED ORDER — SODIUM CHLORIDE 0.9% FLUSH
3.0000 mL | Freq: Two times a day (BID) | INTRAVENOUS | Status: DC
Start: 2017-03-24 — End: 2017-03-24

## 2017-03-24 MED ORDER — FENTANYL CITRATE (PF) 100 MCG/2ML IJ SOLN
INTRAMUSCULAR | Status: AC
  Filled 2017-03-24: qty 2

## 2017-03-24 MED ORDER — LIDOCAINE HCL (PF) 1 % IJ SOLN
INTRAMUSCULAR | Status: DC | PRN
  Administered 2017-03-24: 3 mL via INTRADERMAL

## 2017-03-24 MED ORDER — SODIUM CHLORIDE 0.9 % WEIGHT BASED INFUSION
3.0000 mL/kg/h | INTRAVENOUS | Status: AC
  Administered 2017-03-24: 3 mL/kg/h via INTRAVENOUS

## 2017-03-24 MED ORDER — ASPIRIN 81 MG PO CHEW: 81.0000 mg | CHEWABLE_TABLET | ORAL | Status: DC

## 2017-03-24 MED ORDER — IOPAMIDOL (ISOVUE-370) INJECTION 76%
INTRAVENOUS | Status: DC | PRN
  Administered 2017-03-24: 85 mL via INTRA_ARTERIAL

## 2017-03-24 MED ORDER — HEPARIN (PORCINE) IN NACL 2-0.9 UNIT/ML-% IJ SOLN
INTRAMUSCULAR | Status: AC | PRN
  Administered 2017-03-24: 1000 mL via INTRA_ARTERIAL

## 2017-03-24 MED ORDER — VERAPAMIL HCL 2.5 MG/ML IV SOLN
INTRAVENOUS | Status: DC | PRN
  Administered 2017-03-24: 10 mL via INTRA_ARTERIAL

## 2017-03-24 MED ORDER — SODIUM CHLORIDE 0.9 % IV SOLN: 250.0000 mL | INTRAVENOUS | Status: DC | PRN

## 2017-03-24 SURGICAL SUPPLY — 13 items
CATH INFINITI 5 FR JL3.5 (CATHETERS) ×1 IMPLANT
CATH INFINITI 5FR ANG PIGTAIL (CATHETERS) ×1 IMPLANT
CATH INFINITI JR4 5F (CATHETERS) ×1 IMPLANT
DEVICE RAD COMP TR BAND LRG (VASCULAR PRODUCTS) ×1 IMPLANT
GLIDESHEATH SLEND SS 6F .021 (SHEATH) ×1 IMPLANT
GUIDEWIRE INQWIRE 1.5J.035X260 (WIRE) IMPLANT
INQWIRE 1.5J .035X260CM (WIRE) ×2
KIT HEART LEFT (KITS) ×2 IMPLANT
PACK CARDIAC CATHETERIZATION (CUSTOM PROCEDURE TRAY) ×2 IMPLANT
SYR MEDRAD MARK V 150ML (SYRINGE) ×1 IMPLANT
TRANSDUCER W/STOPCOCK (MISCELLANEOUS) ×2 IMPLANT
TUBING CIL FLEX 10 FLL-RA (TUBING) ×2 IMPLANT
WIRE HI TORQ VERSACORE-J 145CM (WIRE) ×1 IMPLANT

## 2017-03-24 NOTE — Discharge Instructions (Signed)

## 2017-03-24 NOTE — Interval H&P Note (Signed)
Cath Lab Visit (complete for each Cath Lab visit)  Clinical Evaluation Leading to the Procedure:   ACS: No.  Non-ACS:    Anginal Classification: CCS III  Anti-ischemic medical therapy: Minimal Therapy (1 class of medications)  Non-Invasive Test Results: No non-invasive testing performed  Prior CABG: No previous CABG   Accelerating angina   History and Physical Interval Note:  03/24/2017 8:49 AM  Christopher Hernandez  has presented today for surgery, with the diagnosis of angina  The various methods of treatment have been discussed with the patient and family. After consideration of risks, benefits and other options for treatment, the patient has consented to  Procedure(s): LEFT HEART CATH AND CORONARY ANGIOGRAPHY (N/A) as a surgical intervention .  The patient's history has been reviewed, patient examined, no change in status, stable for surgery.  I have reviewed the patient's chart and labs.  Questions were answered to the patient's satisfaction.     Larae Grooms

## 2017-03-28 ENCOUNTER — Telehealth: Payer: Self-pay | Admitting: Cardiology

## 2017-03-28 NOTE — Telephone Encounter (Signed)
Pt with continued SOB and Lt chest pain, with nonobstructive CAD on 03/24/17 -- LVEDP is mildly elevated and he may need diuretic, but just concerned that he has not had CXR.  I have asked pt to go to Urgent care for eval and CXR - labs were normal prior to cath.

## 2017-03-31 ENCOUNTER — Telehealth: Payer: Self-pay | Admitting: Interventional Cardiology

## 2017-03-31 DIAGNOSIS — R9430 Abnormal result of cardiovascular function study, unspecified: Secondary | ICD-10-CM

## 2017-03-31 NOTE — Telephone Encounter (Signed)
F/U Call: ° °Patient returning call °

## 2017-03-31 NOTE — Telephone Encounter (Signed)
New Message  Pt wife call requesting to speak with RN about a prescribe that was to be sent to the pharmacy for fluid for pt

## 2017-03-31 NOTE — Telephone Encounter (Signed)
Patient's wife calling regarding the patient being prescribed a fluid pill. Made wife aware that there is no DPR on file giving permission to speak to her and that I would need the patient's permission to speak to her. She verbalizes understanding and states that she will have him call when he gets home from work.

## 2017-03-31 NOTE — Telephone Encounter (Signed)
OK to try Lasix 40 mg daily and  Potassium 20 mEq daily.  BMet in one week.

## 2017-03-31 NOTE — Telephone Encounter (Signed)
Patient calling and gives permission to speak to his wife. The patient and his wife are on speaker phone and state that he has continued to have DOE. He denies any chest pain, LE swelling, or weight gain. Patient and wife states that after he had his cath done on 12/20 that he was told that he would be prescribed a diuretic. Patient had called in on 12/24 and spoke to Cecilie Kicks, NP and was advised to go to urgent care for eval and CXR. Patient states that he did not go because he started to feel better. Patient asking if he can be prescribed diuretic. Made patient aware that the message would be sent to Dr. Irish Lack for review. Patient verbalized understanding and thanked me for the call.

## 2017-04-01 MED ORDER — POTASSIUM CHLORIDE CRYS ER 20 MEQ PO TBCR: 20.0000 meq | EXTENDED_RELEASE_TABLET | Freq: Every day | ORAL | 3 refills | Status: DC

## 2017-04-01 MED ORDER — FUROSEMIDE 40 MG PO TABS: 40.0000 mg | ORAL_TABLET | Freq: Every day | ORAL | 3 refills | Status: DC

## 2017-04-01 NOTE — Telephone Encounter (Signed)
Called and made patient aware of Dr. Hassell Done instructions to start lasix 40 mg QD and potassium 20 mEq QD and to recheck BMET in 1 week. Rx sent to patient's preferred pharmacy, and BMET ordered to be done at follow up appointment on 04/11/17. Patient verbalized understanding and thanked me for the call.

## 2017-04-11 ENCOUNTER — Encounter: Payer: Self-pay | Admitting: Cardiology

## 2017-04-11 ENCOUNTER — Ambulatory Visit: Payer: Medicare HMO | Admitting: Cardiology

## 2017-04-11 VITALS — BP 116/60 | HR 79 | Resp 16 | Ht 69.0 in | Wt 273.0 lb

## 2017-04-11 DIAGNOSIS — I2510 Atherosclerotic heart disease of native coronary artery without angina pectoris: Secondary | ICD-10-CM

## 2017-04-11 DIAGNOSIS — I5030 Unspecified diastolic (congestive) heart failure: Secondary | ICD-10-CM | POA: Diagnosis not present

## 2017-04-11 DIAGNOSIS — Z5181 Encounter for therapeutic drug level monitoring: Secondary | ICD-10-CM | POA: Diagnosis not present

## 2017-04-11 LAB — BASIC METABOLIC PANEL
BUN/Creatinine Ratio: 24 (ref 10–24)
BUN: 23 mg/dL (ref 8–27)
CALCIUM: 9.2 mg/dL (ref 8.6–10.2)
CHLORIDE: 105 mmol/L (ref 96–106)
CO2: 21 mmol/L (ref 20–29)
Creatinine, Ser: 0.95 mg/dL (ref 0.76–1.27)
GFR calc Af Amer: 93 mL/min/{1.73_m2} (ref >59)
GFR, EST NON AFRICAN AMERICAN: 80 mL/min/{1.73_m2} (ref >59)
Glucose: 143 mg/dL — ABNORMAL HIGH (ref 65–99)
POTASSIUM: 4.4 mmol/L (ref 3.5–5.2)
SODIUM: 141 mmol/L (ref 134–144)

## 2017-04-11 NOTE — Progress Notes (Signed)
04/11/2017 [name]   05/06/1945  [id number]  Primary Physician [name], MD Primary Cardiologist: Dr. Irish Lack   Reason for Visit/CC: Acute Diastolic CHF and Post Cath f/u  HPI:  Christopher Hernandez is a 72 y.o. male who is being seen today for diastolic HF, medication monitoring, as well as for post cath f/u.  Cardiac risk factors include HTN, HLD, DM and obesity. Also a h/o OSA, on CPAP therapy. He was recently seen in clinic by Dr. Irish Lack and complained of exertional dyspnea, fatigue and some chest tightness. He was set up for elective LHC, performed 03/24/17. He was found to have nonobstructive CAD (10-25% disease in RCA, LAD and LCx). EF was normal at 55-60%. LVEDP was mildly elevated. He was discharged home with plans to continue medical therapy of cardiac risk factors for further prevention.   On 03/31/17, he called the office with complaints of continued dyspnea, in addition to new LEE. He was ordered by Dr. Irish Lack to start Lasix 40 mg in addition to KDur, 10 mEq daily. He was instructed to f/u in 1 week for office visit and f/u BMET.   Today, he notes improvement in symptoms and edema. He is tolerating new meds w/o side effects. Good UOP. BP is stable.     LHC 03/24/17  Conclusion     Mid RCA lesion is 10% stenosed.  Mid Cx lesion is 25% stenosed.  Ost LAD to Prox LAD lesion is 10% stenosed.  Mid LAD lesion is 25% stenosed.  The left ventricular systolic function is normal.  LV end diastolic pressure is mildly elevated.  The left ventricular ejection fraction is 55-65% by visual estimate.  There is no aortic valve stenosis.   Nonobstructive coronary artery disease.  Continue preventive therapy.     No outpatient medications have been marked as taking for the 04/11/17 encounter (Appointment) with Consuelo Pandy, PA-C.   No Known Allergies Past Medical History:  Diagnosis Date  . BMI 40.0-44.9, adult ([place])   . Chest pain at rest   . Diabetes  mellitus   . Hyperlipemia   . Hypertension   . OSA on CPAP   . Peptic ulcer disease   . Recurrent major depressive disorder ([place])   . Rhinitis, allergic   . Routine general medical examination at a health care facility   . Xerosis of skin    Family History  Problem Relation Age of Onset  . Cirrhosis Father   . Liver disease Father   . Hypertension Mother   . Diabetes Mellitus I Mother   . Colon cancer Neg Hx   . Prostate cancer Neg Hx   . Testicular cancer Neg Hx    Past Surgical History:  Procedure Laterality Date  . LEFT HEART CATH AND CORONARY ANGIOGRAPHY N/A 03/24/2017   Procedure: LEFT HEART CATH AND CORONARY ANGIOGRAPHY;  Surgeon: Jettie Booze, MD;  Location: [facility];  Service: Cardiovascular;  Laterality: N/A;   Social History   Socioeconomic History  . Marital status: Married    Spouse name: Not on file  . Number of children: Not on file  . Years of education: Not on file  . Highest education level: Not on file  Social Needs  . Financial resource strain: Not on file  . Food insecurity - worry: Not on file  . Food insecurity - inability: Not on file  . Transportation needs - medical: Not on file  . Transportation needs - non-medical: Not on file  Occupational History  . Not on file  Tobacco Use  . Smoking status: Never Smoker  . Smokeless tobacco: Never Used  . Tobacco comment: TRIED AS A TEENAGER  Substance and Sexual Activity  . Alcohol use: No  . Drug use: No  . Sexual activity: Not on file  Other Topics Concern  . Not on file  Social History Narrative  . Not on file     Review of Systems: General: negative for chills, fever, night sweats or weight changes.  Cardiovascular: negative for chest pain, dyspnea on exertion, edema, orthopnea, palpitations, paroxysmal nocturnal dyspnea or shortness of breath Dermatological: negative for rash Respiratory: negative for cough or wheezing Urologic: negative for hematuria Abdominal:  negative for nausea, vomiting, diarrhea, bright red blood per rectum, melena, or hematemesis Neurologic: negative for visual changes, syncope, or dizziness All other systems reviewed and are otherwise negative except as noted above.   Physical Exam:  There were no vitals taken for this visit.  General appearance: alert, cooperative and no distress Neck: no carotid bruit and no JVD Lungs: clear to auscultation bilaterally Heart: regular rate and rhythm, S1, S2 normal, no murmur, click, rub or gallop Extremities: extremities normal, atraumatic, no cyanosis or edema Pulses: 2+ and symmetric Skin: Skin color, texture, turgor normal. No rashes or lesions Neurologic: Grossly normal  EKG not performed  -- personally reviewed   ASSESSMENT AND PLAN:   1. Diastolic HF: volume appears stable on exam. No LEE. Lungs are CTAB. Symptoms improved after addition of lasix. BP is stable and well controlled. Check BMP today. Continue Lasix and supplemental K. We discussed checking weight weekly. He will call our office if > 5 lb weight gain in 1 week. We also discussed low sodium diet.   2. Nonobstructive CAD: continue medical therapy for secondary prevention and continued management of other cardiac risk factors.   3. Medication Monitoring: diuretic and supplemental K recently started. We will check a BMP today to monitor renal function and K.   4. HTN: controlled on current regimen.   5. HLD: continue statin therapy with simvastatin.   7. DM: per PCP.   8. OSA: compliant with CPAP therapy.    Follow-Up w/ Dr. Irish Lack in 6 months   Brittainy Ladoris Gene, MHS [facility] 04/11/2017 8:27 AM

## 2017-04-11 NOTE — Patient Instructions (Signed)
Medication Instructions:  Your physician recommends that you continue on your current medications as directed. Please refer to the Current Medication list given to you today.   Labwork: TODAY: BMET  Testing/Procedures: NONE ORDERED  Follow-Up: Your physician wants you to follow-up in [place] DR. VARANASI. You will receive a reminder letter in the mail two months in advance. If you don't receive a letter, please call our office to schedule the follow-up appointment.   Any Other Special Instructions Will Be Listed Below (If Applicable).  CHECK YOUR WEIGHT WEEKLY. IF YOU GAIN MORE THAN 5 POUNDS IN ONE WEEK, PLEASE CALL OUR OFFICE FOR FURTHER INSTRUCTIONS AT [phone]    If you need a refill on your cardiac medications before your next appointment, please call your pharmacy.

## 2017-04-28 DIAGNOSIS — Z1211 Encounter for screening for malignant neoplasm of colon: Secondary | ICD-10-CM | POA: Diagnosis not present

## 2017-04-28 DIAGNOSIS — Z01818 Encounter for other preprocedural examination: Secondary | ICD-10-CM | POA: Diagnosis not present

## 2017-06-03 DIAGNOSIS — K640 First degree hemorrhoids: Secondary | ICD-10-CM | POA: Diagnosis not present

## 2017-06-03 DIAGNOSIS — D126 Benign neoplasm of colon, unspecified: Secondary | ICD-10-CM | POA: Diagnosis not present

## 2017-06-03 DIAGNOSIS — Z1211 Encounter for screening for malignant neoplasm of colon: Secondary | ICD-10-CM | POA: Diagnosis not present

## 2017-06-07 DIAGNOSIS — D126 Benign neoplasm of colon, unspecified: Secondary | ICD-10-CM | POA: Diagnosis not present

## 2017-06-20 ENCOUNTER — Telehealth: Payer: Self-pay | Admitting: Cardiology

## 2017-06-20 NOTE — Telephone Encounter (Signed)
I spoke with pt. He reports 3 days after lasix was started he developed dry mouth and leg cramps. This has been off and on.  He was out of town last week and did not take lasix.  Dry mouth and leg cramps went away and he felt much better.  He reports shortness of breath has been present for years and he did not notice any change when he was off lasix last week. Will forward to Dr. Irish Lack for review/recommendations.

## 2017-06-20 NOTE — Telephone Encounter (Signed)
Pt c/o medication issue:  1. Name of Medication: Furosemide   2. How are you currently taking this medication (dosage and times per day)? 40 mg// once a day   3. Are you having a reaction (difficulty breathing--STAT)? no  4. What is your medication issue? Medication is causing patient to have leg cramps, dry mouth

## 2017-06-21 NOTE — Telephone Encounter (Signed)
Hold Lasix for 2 days. Check BMet.  Restart Lasix at 20 mg daily after 2 days off.

## 2017-06-21 NOTE — Telephone Encounter (Signed)
Called to inform patient of recommendations. The patient was not home. The wife answered. No DPR on file. Will call patient on his cell phone.

## 2017-06-21 NOTE — Telephone Encounter (Signed)
Left message for patient to call back  

## 2017-06-21 NOTE — Telephone Encounter (Signed)
Called and spoke with patient. Patient states that he has not taken his lasix since Friday 3/15. Patient states that he has felt better not taking the lasix and states that his dry mouth and leg cramping has improved. Patient denies having LEE, SOB, or significant weight gain. Patient has however continued to take his potassium 20 mEq QD. Instructed patient to hold his potassium while he is not taking lasix. Patient works out of town and is not able to come in for a lab check. He states that he will be passing by our office tomorrow and will be able to stop and have BMET checked. Lab appointment made for tomorrow. Made patient aware that I would discuss this further with Dr. Irish Lack and would call him back with additional recommendations.   Patient also gives permission to speak with his wife.

## 2017-06-21 NOTE — Telephone Encounter (Signed)
Follow up     Patient wife calling to provide updated cell phone number for patient . Please call patient on [phone].

## 2017-06-22 ENCOUNTER — Telehealth: Payer: Self-pay | Admitting: Interventional Cardiology

## 2017-06-22 ENCOUNTER — Encounter: Payer: Self-pay | Admitting: Interventional Cardiology

## 2017-06-22 DIAGNOSIS — I5031 Acute diastolic (congestive) heart failure: Secondary | ICD-10-CM

## 2017-06-22 DIAGNOSIS — I10 Essential (primary) hypertension: Secondary | ICD-10-CM

## 2017-06-22 MED ORDER — FUROSEMIDE 40 MG PO TABS: 40.0000 mg | ORAL_TABLET | Freq: Every day | ORAL | 0 refills | Status: DC | PRN

## 2017-06-22 MED ORDER — POTASSIUM CHLORIDE CRYS ER 20 MEQ PO TBCR: 20.0000 meq | EXTENDED_RELEASE_TABLET | Freq: Every day | ORAL | 0 refills | Status: DC | PRN

## 2017-06-22 NOTE — Telephone Encounter (Signed)
Christopher Hernandez returning call regarding Lasix question/feedback. Please call back.  Thank you

## 2017-06-22 NOTE — Telephone Encounter (Signed)
Made patient aware that I spoke with Dr. Irish Lack. Made patient aware that he will need to keep his lab appointment today to check BMET. Also made patient aware that we will make his lasix PRN for LEE, SOB, or weight gain. Patient understandeds that his potassium will be PRN as well and is to only be taken when taking lasix. Patient verbalized understanding and thanked me for the call.

## 2017-06-22 NOTE — Telephone Encounter (Signed)
New Message:   Pt is calling to schedule blood work that he thought he may be able to make it today but would like to have it sometime on Friday if possible after 9:30

## 2017-06-22 NOTE — Telephone Encounter (Signed)
Spoke with wife (patient gives permission) and rescheduled lab appointment for Friday 3-22 for BMET.

## 2017-06-22 NOTE — Telephone Encounter (Signed)
This encounter was created in error - please disregard.

## 2017-06-24 ENCOUNTER — Other Ambulatory Visit: Payer: Medicare HMO

## 2017-06-24 DIAGNOSIS — I10 Essential (primary) hypertension: Secondary | ICD-10-CM | POA: Diagnosis not present

## 2017-06-24 LAB — BASIC METABOLIC PANEL
BUN/Creatinine Ratio: 16 (ref 10–24)
BUN: 14 mg/dL (ref 8–27)
CALCIUM: 9.2 mg/dL (ref 8.6–10.2)
CO2: 22 mmol/L (ref 20–29)
Chloride: 105 mmol/L (ref 96–106)
Creatinine, Ser: 0.85 mg/dL (ref 0.76–1.27)
GFR, EST AFRICAN AMERICAN: 101 mL/min/{1.73_m2} (ref >59)
GFR, EST NON AFRICAN AMERICAN: 88 mL/min/{1.73_m2} (ref >59)
Glucose: 147 mg/dL — ABNORMAL HIGH (ref 65–99)
POTASSIUM: 4.5 mmol/L (ref 3.5–5.2)
Sodium: 141 mmol/L (ref 134–144)

## 2017-06-27 ENCOUNTER — Telehealth: Payer: Self-pay | Admitting: Interventional Cardiology

## 2017-06-27 NOTE — Telephone Encounter (Signed)
Patient calling, returning call for lab results

## 2017-06-27 NOTE — Telephone Encounter (Signed)
Patient made aware of results. Patient verbalizes understanding. Patient denies having any SOB, LEE, or weight gain since switching his lasix and potassium to PRN.

## 2017-06-27 NOTE — Telephone Encounter (Signed)
-----   Message from Jettie Booze, MD sent at 06/27/2017  8:53 AM EDT ----- Normal electrolytes.

## 2017-08-15 DIAGNOSIS — G4733 Obstructive sleep apnea (adult) (pediatric): Secondary | ICD-10-CM | POA: Diagnosis not present

## 2017-12-26 DIAGNOSIS — G4733 Obstructive sleep apnea (adult) (pediatric): Secondary | ICD-10-CM | POA: Diagnosis not present

## 2017-12-26 DIAGNOSIS — E1169 Type 2 diabetes mellitus with other specified complication: Secondary | ICD-10-CM | POA: Diagnosis not present

## 2018-02-03 DIAGNOSIS — H524 Presbyopia: Secondary | ICD-10-CM | POA: Diagnosis not present

## 2018-02-10 DIAGNOSIS — I110 Hypertensive heart disease with heart failure: Secondary | ICD-10-CM | POA: Diagnosis not present

## 2018-02-10 DIAGNOSIS — Z0000 Encounter for general adult medical examination without abnormal findings: Secondary | ICD-10-CM | POA: Diagnosis not present

## 2018-02-10 DIAGNOSIS — G4730 Sleep apnea, unspecified: Secondary | ICD-10-CM | POA: Diagnosis not present

## 2018-02-10 DIAGNOSIS — E782 Mixed hyperlipidemia: Secondary | ICD-10-CM | POA: Diagnosis not present

## 2018-02-10 DIAGNOSIS — E1169 Type 2 diabetes mellitus with other specified complication: Secondary | ICD-10-CM | POA: Diagnosis not present

## 2018-02-10 DIAGNOSIS — J309 Allergic rhinitis, unspecified: Secondary | ICD-10-CM | POA: Diagnosis not present

## 2018-02-10 DIAGNOSIS — R69 Illness, unspecified: Secondary | ICD-10-CM | POA: Diagnosis not present

## 2018-02-10 DIAGNOSIS — I5032 Chronic diastolic (congestive) heart failure: Secondary | ICD-10-CM | POA: Diagnosis not present

## 2018-02-10 DIAGNOSIS — Z125 Encounter for screening for malignant neoplasm of prostate: Secondary | ICD-10-CM | POA: Diagnosis not present

## 2018-02-17 DIAGNOSIS — E119 Type 2 diabetes mellitus without complications: Secondary | ICD-10-CM | POA: Diagnosis not present

## 2018-04-24 DIAGNOSIS — S20211A Contusion of right front wall of thorax, initial encounter: Secondary | ICD-10-CM | POA: Diagnosis not present

## 2018-09-22 DIAGNOSIS — R350 Frequency of micturition: Secondary | ICD-10-CM | POA: Diagnosis not present

## 2018-09-22 DIAGNOSIS — E1165 Type 2 diabetes mellitus with hyperglycemia: Secondary | ICD-10-CM | POA: Diagnosis not present

## 2018-09-22 DIAGNOSIS — Z7984 Long term (current) use of oral hypoglycemic drugs: Secondary | ICD-10-CM | POA: Diagnosis not present

## 2018-10-24 ENCOUNTER — Other Ambulatory Visit: Payer: Self-pay | Admitting: Interventional Cardiology

## 2018-10-24 MED ORDER — POTASSIUM CHLORIDE CRYS ER 20 MEQ PO TBCR: 20.0000 meq | EXTENDED_RELEASE_TABLET | Freq: Every day | ORAL | 0 refills | Status: DC | PRN

## 2018-10-24 MED ORDER — FUROSEMIDE 40 MG PO TABS: 40.0000 mg | ORAL_TABLET | Freq: Every day | ORAL | 0 refills | Status: DC | PRN

## 2018-10-25 DIAGNOSIS — I110 Hypertensive heart disease with heart failure: Secondary | ICD-10-CM | POA: Diagnosis not present

## 2018-10-25 DIAGNOSIS — I5032 Chronic diastolic (congestive) heart failure: Secondary | ICD-10-CM | POA: Diagnosis not present

## 2018-10-25 DIAGNOSIS — E782 Mixed hyperlipidemia: Secondary | ICD-10-CM | POA: Diagnosis not present

## 2018-10-25 DIAGNOSIS — I10 Essential (primary) hypertension: Secondary | ICD-10-CM | POA: Diagnosis not present

## 2018-10-25 DIAGNOSIS — E119 Type 2 diabetes mellitus without complications: Secondary | ICD-10-CM | POA: Diagnosis not present

## 2018-10-25 DIAGNOSIS — E1169 Type 2 diabetes mellitus with other specified complication: Secondary | ICD-10-CM | POA: Diagnosis not present

## 2018-10-25 DIAGNOSIS — E1165 Type 2 diabetes mellitus with hyperglycemia: Secondary | ICD-10-CM | POA: Diagnosis not present

## 2018-10-25 DIAGNOSIS — R69 Illness, unspecified: Secondary | ICD-10-CM | POA: Diagnosis not present

## 2018-10-31 DIAGNOSIS — Z125 Encounter for screening for malignant neoplasm of prostate: Secondary | ICD-10-CM | POA: Diagnosis not present

## 2018-10-31 DIAGNOSIS — R351 Nocturia: Secondary | ICD-10-CM | POA: Diagnosis not present

## 2018-11-17 DIAGNOSIS — I5032 Chronic diastolic (congestive) heart failure: Secondary | ICD-10-CM | POA: Diagnosis not present

## 2018-11-17 DIAGNOSIS — E1165 Type 2 diabetes mellitus with hyperglycemia: Secondary | ICD-10-CM | POA: Diagnosis not present

## 2018-11-17 DIAGNOSIS — E119 Type 2 diabetes mellitus without complications: Secondary | ICD-10-CM | POA: Diagnosis not present

## 2018-11-17 DIAGNOSIS — I10 Essential (primary) hypertension: Secondary | ICD-10-CM | POA: Diagnosis not present

## 2018-11-17 DIAGNOSIS — E1169 Type 2 diabetes mellitus with other specified complication: Secondary | ICD-10-CM | POA: Diagnosis not present

## 2018-11-17 DIAGNOSIS — E782 Mixed hyperlipidemia: Secondary | ICD-10-CM | POA: Diagnosis not present

## 2018-11-17 DIAGNOSIS — I110 Hypertensive heart disease with heart failure: Secondary | ICD-10-CM | POA: Diagnosis not present

## 2018-11-17 DIAGNOSIS — R69 Illness, unspecified: Secondary | ICD-10-CM | POA: Diagnosis not present

## 2018-12-15 DIAGNOSIS — I10 Essential (primary) hypertension: Secondary | ICD-10-CM | POA: Diagnosis not present

## 2018-12-15 DIAGNOSIS — E1165 Type 2 diabetes mellitus with hyperglycemia: Secondary | ICD-10-CM | POA: Diagnosis not present

## 2018-12-15 DIAGNOSIS — I5032 Chronic diastolic (congestive) heart failure: Secondary | ICD-10-CM | POA: Diagnosis not present

## 2018-12-15 DIAGNOSIS — I110 Hypertensive heart disease with heart failure: Secondary | ICD-10-CM | POA: Diagnosis not present

## 2018-12-15 DIAGNOSIS — E782 Mixed hyperlipidemia: Secondary | ICD-10-CM | POA: Diagnosis not present

## 2018-12-15 DIAGNOSIS — R69 Illness, unspecified: Secondary | ICD-10-CM | POA: Diagnosis not present

## 2018-12-15 DIAGNOSIS — E1169 Type 2 diabetes mellitus with other specified complication: Secondary | ICD-10-CM | POA: Diagnosis not present

## 2019-01-01 DIAGNOSIS — G4733 Obstructive sleep apnea (adult) (pediatric): Secondary | ICD-10-CM | POA: Diagnosis not present

## 2019-01-15 DIAGNOSIS — L814 Other melanin hyperpigmentation: Secondary | ICD-10-CM | POA: Diagnosis not present

## 2019-01-15 DIAGNOSIS — D225 Melanocytic nevi of trunk: Secondary | ICD-10-CM | POA: Diagnosis not present

## 2019-01-15 DIAGNOSIS — C44329 Squamous cell carcinoma of skin of other parts of face: Secondary | ICD-10-CM | POA: Diagnosis not present

## 2019-01-15 DIAGNOSIS — L718 Other rosacea: Secondary | ICD-10-CM | POA: Diagnosis not present

## 2019-01-24 DIAGNOSIS — I5032 Chronic diastolic (congestive) heart failure: Secondary | ICD-10-CM | POA: Diagnosis not present

## 2019-01-24 DIAGNOSIS — R69 Illness, unspecified: Secondary | ICD-10-CM | POA: Diagnosis not present

## 2019-01-24 DIAGNOSIS — E1165 Type 2 diabetes mellitus with hyperglycemia: Secondary | ICD-10-CM | POA: Diagnosis not present

## 2019-01-24 DIAGNOSIS — I110 Hypertensive heart disease with heart failure: Secondary | ICD-10-CM | POA: Diagnosis not present

## 2019-01-24 DIAGNOSIS — E782 Mixed hyperlipidemia: Secondary | ICD-10-CM | POA: Diagnosis not present

## 2019-01-24 DIAGNOSIS — E1169 Type 2 diabetes mellitus with other specified complication: Secondary | ICD-10-CM | POA: Diagnosis not present

## 2019-01-24 DIAGNOSIS — I10 Essential (primary) hypertension: Secondary | ICD-10-CM | POA: Diagnosis not present

## 2019-02-08 DIAGNOSIS — C44329 Squamous cell carcinoma of skin of other parts of face: Secondary | ICD-10-CM | POA: Diagnosis not present

## 2019-02-08 DIAGNOSIS — Z85828 Personal history of other malignant neoplasm of skin: Secondary | ICD-10-CM | POA: Diagnosis not present

## 2019-02-23 DIAGNOSIS — E1169 Type 2 diabetes mellitus with other specified complication: Secondary | ICD-10-CM | POA: Diagnosis not present

## 2019-02-23 DIAGNOSIS — E1165 Type 2 diabetes mellitus with hyperglycemia: Secondary | ICD-10-CM | POA: Diagnosis not present

## 2019-02-23 DIAGNOSIS — I5032 Chronic diastolic (congestive) heart failure: Secondary | ICD-10-CM | POA: Diagnosis not present

## 2019-02-23 DIAGNOSIS — I10 Essential (primary) hypertension: Secondary | ICD-10-CM | POA: Diagnosis not present

## 2019-02-23 DIAGNOSIS — E782 Mixed hyperlipidemia: Secondary | ICD-10-CM | POA: Diagnosis not present

## 2019-02-23 DIAGNOSIS — R69 Illness, unspecified: Secondary | ICD-10-CM | POA: Diagnosis not present

## 2019-03-06 DIAGNOSIS — Z1389 Encounter for screening for other disorder: Secondary | ICD-10-CM | POA: Diagnosis not present

## 2019-03-06 DIAGNOSIS — J309 Allergic rhinitis, unspecified: Secondary | ICD-10-CM | POA: Diagnosis not present

## 2019-03-06 DIAGNOSIS — G4730 Sleep apnea, unspecified: Secondary | ICD-10-CM | POA: Diagnosis not present

## 2019-03-06 DIAGNOSIS — I110 Hypertensive heart disease with heart failure: Secondary | ICD-10-CM | POA: Diagnosis not present

## 2019-03-06 DIAGNOSIS — I5032 Chronic diastolic (congestive) heart failure: Secondary | ICD-10-CM | POA: Diagnosis not present

## 2019-03-06 DIAGNOSIS — E1169 Type 2 diabetes mellitus with other specified complication: Secondary | ICD-10-CM | POA: Diagnosis not present

## 2019-03-06 DIAGNOSIS — Z0000 Encounter for general adult medical examination without abnormal findings: Secondary | ICD-10-CM | POA: Diagnosis not present

## 2019-03-06 DIAGNOSIS — R69 Illness, unspecified: Secondary | ICD-10-CM | POA: Diagnosis not present

## 2019-03-06 DIAGNOSIS — E782 Mixed hyperlipidemia: Secondary | ICD-10-CM | POA: Diagnosis not present

## 2019-03-09 ENCOUNTER — Telehealth: Payer: Self-pay | Admitting: Interventional Cardiology

## 2019-03-09 NOTE — Telephone Encounter (Signed)
Called and spoke to patient. He states that his PCP wanted him to let us know about his BP readings (see below). Denies having any Sx. Takes lisinopril 10 mg QD. Educated on low salt diet. Instructed patient to monitor BP and let us know if consistently greater than 140/90.

## 2019-03-09 NOTE — Telephone Encounter (Signed)
Pt c/o BP issue: STAT if pt c/o blurred vision, one-sided weakness or slurred speech  1. What are your last 5 BP readings?  135/62 147/66 136/71 133/79  2. Are you having any other symptoms (ex. Dizziness, headache, blurred vision, passed out)? no  3. What is your BP issue? Patient went to see his PCP and the PCP is concerned about the fluctuation of his  his readings. The patient does not know what is considered normal for him. He feels fine and does not have any issues

## 2019-03-23 DIAGNOSIS — I5032 Chronic diastolic (congestive) heart failure: Secondary | ICD-10-CM | POA: Diagnosis not present

## 2019-03-23 DIAGNOSIS — I110 Hypertensive heart disease with heart failure: Secondary | ICD-10-CM | POA: Diagnosis not present

## 2019-03-23 DIAGNOSIS — E1165 Type 2 diabetes mellitus with hyperglycemia: Secondary | ICD-10-CM | POA: Diagnosis not present

## 2019-03-23 DIAGNOSIS — E782 Mixed hyperlipidemia: Secondary | ICD-10-CM | POA: Diagnosis not present

## 2019-03-23 DIAGNOSIS — R69 Illness, unspecified: Secondary | ICD-10-CM | POA: Diagnosis not present

## 2019-03-23 DIAGNOSIS — E1169 Type 2 diabetes mellitus with other specified complication: Secondary | ICD-10-CM | POA: Diagnosis not present

## 2019-03-23 DIAGNOSIS — I10 Essential (primary) hypertension: Secondary | ICD-10-CM | POA: Diagnosis not present

## 2019-03-26 DIAGNOSIS — E1169 Type 2 diabetes mellitus with other specified complication: Secondary | ICD-10-CM | POA: Diagnosis not present

## 2019-03-26 DIAGNOSIS — E782 Mixed hyperlipidemia: Secondary | ICD-10-CM | POA: Diagnosis not present

## 2019-04-15 DIAGNOSIS — L02213 Cutaneous abscess of chest wall: Secondary | ICD-10-CM | POA: Diagnosis not present

## 2019-04-18 DIAGNOSIS — G4733 Obstructive sleep apnea (adult) (pediatric): Secondary | ICD-10-CM | POA: Diagnosis not present

## 2019-04-19 DIAGNOSIS — E1169 Type 2 diabetes mellitus with other specified complication: Secondary | ICD-10-CM | POA: Diagnosis not present

## 2019-04-19 DIAGNOSIS — I5032 Chronic diastolic (congestive) heart failure: Secondary | ICD-10-CM | POA: Diagnosis not present

## 2019-04-19 DIAGNOSIS — E1165 Type 2 diabetes mellitus with hyperglycemia: Secondary | ICD-10-CM | POA: Diagnosis not present

## 2019-04-19 DIAGNOSIS — I10 Essential (primary) hypertension: Secondary | ICD-10-CM | POA: Diagnosis not present

## 2019-04-19 DIAGNOSIS — R69 Illness, unspecified: Secondary | ICD-10-CM | POA: Diagnosis not present

## 2019-04-19 DIAGNOSIS — E782 Mixed hyperlipidemia: Secondary | ICD-10-CM | POA: Diagnosis not present

## 2019-04-19 DIAGNOSIS — I110 Hypertensive heart disease with heart failure: Secondary | ICD-10-CM | POA: Diagnosis not present

## 2019-05-08 ENCOUNTER — Ambulatory Visit: Payer: Medicare HMO | Admitting: Interventional Cardiology

## 2019-05-17 DIAGNOSIS — R69 Illness, unspecified: Secondary | ICD-10-CM | POA: Diagnosis not present

## 2019-05-17 DIAGNOSIS — I110 Hypertensive heart disease with heart failure: Secondary | ICD-10-CM | POA: Diagnosis not present

## 2019-05-17 DIAGNOSIS — I10 Essential (primary) hypertension: Secondary | ICD-10-CM | POA: Diagnosis not present

## 2019-05-17 DIAGNOSIS — E1169 Type 2 diabetes mellitus with other specified complication: Secondary | ICD-10-CM | POA: Diagnosis not present

## 2019-05-17 DIAGNOSIS — E782 Mixed hyperlipidemia: Secondary | ICD-10-CM | POA: Diagnosis not present

## 2019-05-19 DIAGNOSIS — G4733 Obstructive sleep apnea (adult) (pediatric): Secondary | ICD-10-CM | POA: Diagnosis not present

## 2019-05-30 NOTE — Progress Notes (Signed)
Cardiology Office Note   Date:  06/01/2019   ID:  Christopher Hernandez, DOB 12/23/1945, MRN [id number]  PCP:  [name], MD    No chief complaint on file.  Chronic diastolic heart failure  Wt Readings from Last 3 Encounters:  06/01/19 272 lb 12.8 oz (123.7 kg)  04/11/17 273 lb (123.8 kg)  03/24/17 277 lb (125.6 kg)       History of Present Illness: Christopher Hernandez is a 74 y.o. male  who is being seen today for diastolic HF, medication monitoring, as well as for post cath f/u.  Cardiac risk factors include HTN, HLD, DM and obesity. Also a h/o OSA, on CPAP therapy. In 2018, he complained of exertional dyspnea, fatigue and some chest tightness. He was set up for elective LHC, performed 03/24/17. He was found to have nonobstructive CAD (10-25% disease in RCA, LAD and LCx). EF was normal at 55-60%. LVEDP was mildly elevated. He was discharged home with plans to continue medical therapy of cardiac risk factors for further prevention.   On 03/31/17, he called the office with complaints of continued dyspnea, in addition to new LEE. He was told to start Lasix 40 mg in addition to KDur, 10 mEq daily.   Lisinopril was started for BP and readings were improved in Dec 2020.  Since the last visit, he has done well with his BP.  Home readings are in the [id number] range systolics typically.  Highest readings are in the high 140s.   Denies : Chest pain. Dizziness. Leg edema. Nitroglycerin use. Orthopnea. Palpitations. Paroxysmal nocturnal dyspnea.  Syncope.   He does some walking, "not too much."  He walks at work a lot.  He works on generators.  Past Medical History:  Diagnosis Date   BMI 40.0-44.9, adult ([place])    Chest pain at rest    Diabetes mellitus    Hyperlipemia    Hypertension    OSA on CPAP    Peptic ulcer disease    Recurrent major depressive disorder (HCC)    Rhinitis, allergic    Routine general medical examination at a health care facility    Xerosis of skin       Past Surgical History:  Procedure Laterality Date   LEFT HEART CATH AND CORONARY ANGIOGRAPHY N/A 03/24/2017   Procedure: LEFT HEART CATH AND CORONARY ANGIOGRAPHY;  Surgeon: Jettie Booze, MD;  Location: [facility];  Service: Cardiovascular;  Laterality: N/A;     Current Outpatient Medications  Medication Sig Dispense Refill   aspirin 81 MG tablet Take 81 mg by mouth daily.     fluticasone (FLONASE) 50 MCG/ACT nasal spray Place into both nostrils daily.     glimepiride (AMARYL) 2 MG tablet Take 2 mg by mouth every morning.     lisinopril (ZESTRIL) 20 MG tablet Take 20 mg by mouth at bedtime.     metFORMIN (GLUCOPHAGE) 1000 MG tablet Take 1,000 mg by mouth every other day.     pioglitazone (ACTOS) 30 MG tablet Take 30 mg by mouth daily.      potassium chloride SA (K-DUR) 20 MEQ tablet Take 1 tablet (20 mEq total) by mouth daily as needed (Only take with lasix). Please make overdue appt with Dr. Irish Lack before anymore refills. 1st attempt 30 tablet 0   sertraline (ZOLOFT) 100 MG tablet Take 100 mg by mouth at bedtime.      simvastatin (ZOCOR) 40 MG tablet Take 40 mg by mouth every evening.  No current facility-administered medications for this visit.    Allergies:   Patient has no known allergies.    Social History:  The patient  reports that he has never smoked. He has never used smokeless tobacco. He reports that he does not drink alcohol or use drugs.   Family History:  The patient's family history includes Cirrhosis in his father; Diabetes Mellitus I in his mother; Hypertension in his mother; Liver disease in his father.    ROS:  Please see the history of present illness.   Otherwise, review of systems are positive for chronic DOE.   All other systems are reviewed and negative.    PHYSICAL EXAM: VS:  BP 138/74    Pulse 64    Ht 5\' 9"  (1.753 m)    Wt 272 lb 12.8 oz (123.7 kg)    SpO2 95%    BMI 40.29 kg/m  , BMI Body mass index is 40.29  kg/m. GEN: Well nourished, well developed, in no acute distress  HEENT: normal  Neck: no JVD, carotid bruits, or masses Cardiac: RRR; no murmurs, rubs, or gallops,no edema  Respiratory:  clear to auscultation bilaterally, normal work of breathing GI: soft, nontender, nondistended, + BS MS: no deformity or atrophy  Skin: warm and dry, no rash Neuro:  Strength and sensation are intact Psych: euthymic mood, full affect   EKG:   The ekg ordered today demonstrates NSR, prolonged PR interval, RBBB   Recent Labs: No results found for requested labs within last 8760 hours.   Lipid Panel No results found for: CHOL, TRIG, HDL, CHOLHDL, VLDL, LDLCALC, LDLDIRECT   Other studies Reviewed: Additional studies/ records that were reviewed today with results demonstrating: labs from PMD reviewed.     ASSESSMENT AND PLAN:  1. Chronic diastolic heart failure: Appears euvolemic.  Increase exercise to target below. 2. HTN: The current medical regimen is effective;  continue present plan and medications. 3. LE edema: Started on lasix in the past.  No edema at this time.  4. We spoke about improving diet. Decrease processed food and fast food intake.  5. COVID vaccine recommended.  He is practicing social distancing.   Current medicines are reviewed at length with the patient today.  The patient concerns regarding his medicines were addressed.  The following changes have been made:  No change  Labs/ tests ordered today include:  No orders of the defined types were placed in this encounter.   Recommend 150 minutes/week of aerobic exercise Low fat, low carb, high fiber diet recommended  Disposition:   FU in 1 year   Signed, Larae Grooms, MD  06/01/2019 11:03 AM    [facility] [address], [place], [place]  [address] Phone: [phone]; Fax: [phone]

## 2019-06-01 ENCOUNTER — Other Ambulatory Visit: Payer: Self-pay

## 2019-06-01 ENCOUNTER — Encounter: Payer: Self-pay | Admitting: Interventional Cardiology

## 2019-06-01 ENCOUNTER — Ambulatory Visit: Payer: Medicare HMO | Admitting: Interventional Cardiology

## 2019-06-01 VITALS — BP 138/74 | HR 64 | Ht 69.0 in | Wt 272.8 lb

## 2019-06-01 DIAGNOSIS — I5031 Acute diastolic (congestive) heart failure: Secondary | ICD-10-CM

## 2019-06-01 DIAGNOSIS — I10 Essential (primary) hypertension: Secondary | ICD-10-CM | POA: Diagnosis not present

## 2019-06-01 DIAGNOSIS — R600 Localized edema: Secondary | ICD-10-CM

## 2019-06-01 NOTE — Patient Instructions (Signed)
Medication Instructions:  Your physician recommends that you continue on your current medications as directed. Please refer to the Current Medication list given to you today.  *If you need a refill on your cardiac medications before your next appointment, please call your pharmacy*   Lab Work: None ordered  If you have labs (blood work) drawn today and your tests are completely normal, you will receive your results only by: . MyChart Message (if you have MyChart) OR . A paper copy in the mail If you have any lab test that is abnormal or we need to change your treatment, we will call you to review the results.   Testing/Procedures: None ordered   Follow-Up: At [facility], you and your health needs are our priority.  As part of our continuing mission to provide you with exceptional heart care, we have created designated Provider Care Teams.  These Care Teams include your primary Cardiologist (physician) and Advanced Practice Providers (APPs -  Physician Assistants and Nurse Practitioners) who all work together to provide you with the care you need, when you need it.  We recommend signing up for the patient portal called "MyChart".  Sign up information is provided on this After Visit Summary.  MyChart is used to connect with patients for Virtual Visits (Telemedicine).  Patients are able to view lab/test results, encounter notes, upcoming appointments, etc.  Non-urgent messages can be sent to your provider as well.   To learn more about what you can do with MyChart, go to [email or web address].    Your next appointment:   12 month(s)  The format for your next appointment:   In Person  Provider:   You may see Jayadeep Varanasi, MD or one of the following Advanced Practice Providers on your designated Care Team:    Dayna Dunn, PA-C  Michele Lenze, PA-C    Other Instructions  High-Fiber Diet Fiber, also called dietary fiber, is a type of carbohydrate that is found in fruits,  vegetables, whole grains, and beans. A high-fiber diet can have many health benefits. Your health care provider may recommend a high-fiber diet to help:  Prevent constipation. Fiber can make your bowel movements more regular.  Lower your cholesterol.  Relieve the following conditions: ? Swelling of veins in the anus (hemorrhoids). ? Swelling and irritation (inflammation) of specific areas of the digestive tract (uncomplicated diverticulosis). ? A problem of the large intestine (colon) that sometimes causes pain and diarrhea (irritable bowel syndrome, IBS).  Prevent overeating as part of a weight-loss plan.  Prevent heart disease, type 2 diabetes, and certain cancers. What is my plan? The recommended daily fiber intake in grams (g) includes:  38 g for men age 50 or younger.  30 g for men over age 50.  25 g for women age 50 or younger.  21 g for women over age 50. You can get the recommended daily intake of dietary fiber by:  Eating a variety of fruits, vegetables, grains, and beans.  Taking a fiber supplement, if it is not possible to get enough fiber through your diet. What do I need to know about a high-fiber diet?  It is better to get fiber through food sources rather than from fiber supplements. There is not a lot of research about how effective supplements are.  Always check the fiber content on the nutrition facts label of any prepackaged food. Look for foods that contain 5 g of fiber or more per serving.  Talk with a diet and nutrition specialist (  dietitian) if you have questions about specific foods that are recommended or not recommended for your medical condition, especially if those foods are not listed below.  Gradually increase how much fiber you consume. If you increase your intake of dietary fiber too quickly, you may have bloating, cramping, or gas.  Drink plenty of water. Water helps you to digest fiber. What are tips for following this plan?  Eat a wide  variety of high-fiber foods.  Make sure that half of the grains that you eat each day are whole grains.  Eat breads and cereals that are made with whole-grain flour instead of refined flour or white flour.  Eat brown rice, bulgur wheat, or millet instead of white rice.  Start the day with a breakfast that is high in fiber, such as a cereal that contains 5 g of fiber or more per serving.  Use beans in place of meat in soups, salads, and pasta dishes.  Eat high-fiber snacks, such as berries, raw vegetables, nuts, and popcorn.  Choose whole fruits and vegetables instead of processed forms like juice or sauce. What foods can I eat?  Fruits Berries. Pears. Apples. Oranges. Avocado. Prunes and raisins. Dried figs. Vegetables Sweet potatoes. Spinach. Kale. Artichokes. Cabbage. Broccoli. Cauliflower. Green peas. Carrots. Squash. Grains Whole-grain breads. Multigrain cereal. Oats and oatmeal. Brown rice. Barley. Bulgur wheat. Millet. Quinoa. Bran muffins. Popcorn. Rye wafer crackers. Meats and other proteins Navy, kidney, and pinto beans. Soybeans. Split peas. Lentils. Nuts and seeds. Dairy Fiber-fortified yogurt. Beverages Fiber-fortified soy milk. Fiber-fortified orange juice. Other foods Fiber bars. The items listed above may not be a complete list of recommended foods and beverages. Contact a dietitian for more options. What foods are not recommended? Fruits Fruit juice. Cooked, strained fruit. Vegetables Fried potatoes. Canned vegetables. Well-cooked vegetables. Grains White bread. Pasta made with refined flour. White rice. Meats and other proteins Fatty cuts of meat. Fried chicken or fried fish. Dairy Milk. Yogurt. Cream cheese. Sour cream. Fats and oils Butters. Beverages Soft drinks. Other foods Cakes and pastries. The items listed above may not be a complete list of foods and beverages to avoid. Contact a dietitian for more information. Summary  Fiber is a type of  carbohydrate. It is found in fruits, vegetables, whole grains, and beans.  There are many health benefits of eating a high-fiber diet, such as preventing constipation, lowering blood cholesterol, helping with weight loss, and reducing your risk of heart disease, diabetes, and certain cancers.  Gradually increase your intake of fiber. Increasing too fast can result in cramping, bloating, and gas. Drink plenty of water while you increase your fiber.  The best sources of fiber include whole fruits and vegetables, whole grains, nuts, seeds, and beans. This information is not intended to replace advice given to you by your health care provider. Make sure you discuss any questions you have with your health care provider. Document Revised: 01/24/2017 Document Reviewed: 01/24/2017 Elsevier Patient Education  2020 Elsevier Inc.   

## 2019-06-16 DIAGNOSIS — G4733 Obstructive sleep apnea (adult) (pediatric): Secondary | ICD-10-CM | POA: Diagnosis not present

## 2019-06-29 ENCOUNTER — Ambulatory Visit: Payer: Medicare HMO | Attending: Internal Medicine

## 2019-06-29 DIAGNOSIS — I110 Hypertensive heart disease with heart failure: Secondary | ICD-10-CM | POA: Diagnosis not present

## 2019-06-29 DIAGNOSIS — R69 Illness, unspecified: Secondary | ICD-10-CM | POA: Diagnosis not present

## 2019-06-29 DIAGNOSIS — Z23 Encounter for immunization: Secondary | ICD-10-CM

## 2019-06-29 DIAGNOSIS — I10 Essential (primary) hypertension: Secondary | ICD-10-CM | POA: Diagnosis not present

## 2019-06-29 DIAGNOSIS — I5032 Chronic diastolic (congestive) heart failure: Secondary | ICD-10-CM | POA: Diagnosis not present

## 2019-06-29 DIAGNOSIS — E1169 Type 2 diabetes mellitus with other specified complication: Secondary | ICD-10-CM | POA: Diagnosis not present

## 2019-06-29 NOTE — Progress Notes (Signed)
   Covid-19 Vaccination Clinic  Name:  Christopher Hernandez    MRN: [id number] DOB: 05/02/1945  06/29/2019  [name] was observed post Covid-19 immunization for 15 minutes without incident. He was provided with Vaccine Information Sheet and instruction to access the V-Safe system.   [name] was instructed to call 911 with any severe reactions post vaccine: Marland Kitchen Difficulty breathing  . Swelling of face and throat  . A fast heartbeat  . A bad rash all over body  . Dizziness and weakness   Immunizations Administered    Name Date Dose VIS Date Route   Pfizer COVID-19 Vaccine 06/29/2019  8:52 AM 0.3 mL 03/16/2019 Intramuscular   Manufacturer: [place]   Lot: [id number]   [place]: [id number]

## 2019-07-23 ENCOUNTER — Ambulatory Visit: Payer: Medicare HMO

## 2019-07-25 DIAGNOSIS — I5032 Chronic diastolic (congestive) heart failure: Secondary | ICD-10-CM | POA: Diagnosis not present

## 2019-07-25 DIAGNOSIS — E782 Mixed hyperlipidemia: Secondary | ICD-10-CM | POA: Diagnosis not present

## 2019-07-25 DIAGNOSIS — E1169 Type 2 diabetes mellitus with other specified complication: Secondary | ICD-10-CM | POA: Diagnosis not present

## 2019-07-25 DIAGNOSIS — I10 Essential (primary) hypertension: Secondary | ICD-10-CM | POA: Diagnosis not present

## 2019-07-25 DIAGNOSIS — I110 Hypertensive heart disease with heart failure: Secondary | ICD-10-CM | POA: Diagnosis not present

## 2019-07-25 DIAGNOSIS — R69 Illness, unspecified: Secondary | ICD-10-CM | POA: Diagnosis not present

## 2019-07-31 ENCOUNTER — Ambulatory Visit: Payer: Medicare HMO | Attending: Internal Medicine

## 2019-07-31 DIAGNOSIS — Z23 Encounter for immunization: Secondary | ICD-10-CM

## 2019-07-31 NOTE — Progress Notes (Signed)
   Covid-19 Vaccination Clinic  Name:  Christopher Hernandez    MRN: [id number] DOB: February 21, 1946  07/31/2019  Mr. [name] was observed post Covid-19 immunization for 15 minutes without incident. He was provided with Vaccine Information Sheet and instruction to access the V-Safe system.   Mr. [name] was instructed to call 911 with any severe reactions post vaccine: Marland Kitchen Difficulty breathing  . Swelling of face and throat  . A fast heartbeat  . A bad rash all over body  . Dizziness and weakness   Immunizations Administered    Name Date Dose VIS Date Route   Pfizer COVID-19 Vaccine 07/31/2019 11:47 AM 0.3 mL 05/30/2018 Intramuscular   Manufacturer: [place]   Lot: [id number]   [place]: [id number]

## 2019-08-24 DIAGNOSIS — L570 Actinic keratosis: Secondary | ICD-10-CM | POA: Diagnosis not present

## 2019-08-24 DIAGNOSIS — L738 Other specified follicular disorders: Secondary | ICD-10-CM | POA: Diagnosis not present

## 2019-08-24 DIAGNOSIS — L814 Other melanin hyperpigmentation: Secondary | ICD-10-CM | POA: Diagnosis not present

## 2019-08-24 DIAGNOSIS — L821 Other seborrheic keratosis: Secondary | ICD-10-CM | POA: Diagnosis not present

## 2019-08-24 DIAGNOSIS — D1801 Hemangioma of skin and subcutaneous tissue: Secondary | ICD-10-CM | POA: Diagnosis not present

## 2019-08-24 DIAGNOSIS — Z85828 Personal history of other malignant neoplasm of skin: Secondary | ICD-10-CM | POA: Diagnosis not present

## 2019-08-31 DIAGNOSIS — I10 Essential (primary) hypertension: Secondary | ICD-10-CM | POA: Diagnosis not present

## 2019-08-31 DIAGNOSIS — R69 Illness, unspecified: Secondary | ICD-10-CM | POA: Diagnosis not present

## 2019-08-31 DIAGNOSIS — I5032 Chronic diastolic (congestive) heart failure: Secondary | ICD-10-CM | POA: Diagnosis not present

## 2019-08-31 DIAGNOSIS — E782 Mixed hyperlipidemia: Secondary | ICD-10-CM | POA: Diagnosis not present

## 2019-08-31 DIAGNOSIS — E1169 Type 2 diabetes mellitus with other specified complication: Secondary | ICD-10-CM | POA: Diagnosis not present

## 2019-08-31 DIAGNOSIS — I110 Hypertensive heart disease with heart failure: Secondary | ICD-10-CM | POA: Diagnosis not present

## 2019-08-31 DIAGNOSIS — E1165 Type 2 diabetes mellitus with hyperglycemia: Secondary | ICD-10-CM | POA: Diagnosis not present

## 2019-09-14 DIAGNOSIS — I110 Hypertensive heart disease with heart failure: Secondary | ICD-10-CM | POA: Diagnosis not present

## 2019-09-24 DIAGNOSIS — E1165 Type 2 diabetes mellitus with hyperglycemia: Secondary | ICD-10-CM | POA: Diagnosis not present

## 2019-09-24 DIAGNOSIS — E782 Mixed hyperlipidemia: Secondary | ICD-10-CM | POA: Diagnosis not present

## 2019-09-24 DIAGNOSIS — I110 Hypertensive heart disease with heart failure: Secondary | ICD-10-CM | POA: Diagnosis not present

## 2019-09-24 DIAGNOSIS — E1169 Type 2 diabetes mellitus with other specified complication: Secondary | ICD-10-CM | POA: Diagnosis not present

## 2019-09-24 DIAGNOSIS — I5032 Chronic diastolic (congestive) heart failure: Secondary | ICD-10-CM | POA: Diagnosis not present

## 2019-09-24 DIAGNOSIS — R69 Illness, unspecified: Secondary | ICD-10-CM | POA: Diagnosis not present

## 2019-09-24 DIAGNOSIS — I10 Essential (primary) hypertension: Secondary | ICD-10-CM | POA: Diagnosis not present

## 2019-10-25 DIAGNOSIS — E1165 Type 2 diabetes mellitus with hyperglycemia: Secondary | ICD-10-CM | POA: Diagnosis not present

## 2019-10-25 DIAGNOSIS — E782 Mixed hyperlipidemia: Secondary | ICD-10-CM | POA: Diagnosis not present

## 2019-10-25 DIAGNOSIS — I5032 Chronic diastolic (congestive) heart failure: Secondary | ICD-10-CM | POA: Diagnosis not present

## 2019-10-25 DIAGNOSIS — R69 Illness, unspecified: Secondary | ICD-10-CM | POA: Diagnosis not present

## 2019-10-25 DIAGNOSIS — I10 Essential (primary) hypertension: Secondary | ICD-10-CM | POA: Diagnosis not present

## 2019-10-25 DIAGNOSIS — I110 Hypertensive heart disease with heart failure: Secondary | ICD-10-CM | POA: Diagnosis not present

## 2019-10-25 DIAGNOSIS — E1169 Type 2 diabetes mellitus with other specified complication: Secondary | ICD-10-CM | POA: Diagnosis not present

## 2019-11-21 DIAGNOSIS — E1165 Type 2 diabetes mellitus with hyperglycemia: Secondary | ICD-10-CM | POA: Diagnosis not present

## 2019-11-21 DIAGNOSIS — I110 Hypertensive heart disease with heart failure: Secondary | ICD-10-CM | POA: Diagnosis not present

## 2019-11-21 DIAGNOSIS — I5032 Chronic diastolic (congestive) heart failure: Secondary | ICD-10-CM | POA: Diagnosis not present

## 2019-11-21 DIAGNOSIS — I10 Essential (primary) hypertension: Secondary | ICD-10-CM | POA: Diagnosis not present

## 2019-11-21 DIAGNOSIS — E782 Mixed hyperlipidemia: Secondary | ICD-10-CM | POA: Diagnosis not present

## 2019-11-21 DIAGNOSIS — R69 Illness, unspecified: Secondary | ICD-10-CM | POA: Diagnosis not present

## 2019-11-21 DIAGNOSIS — E1169 Type 2 diabetes mellitus with other specified complication: Secondary | ICD-10-CM | POA: Diagnosis not present

## 2019-12-21 DIAGNOSIS — I110 Hypertensive heart disease with heart failure: Secondary | ICD-10-CM | POA: Diagnosis not present

## 2019-12-21 DIAGNOSIS — R69 Illness, unspecified: Secondary | ICD-10-CM | POA: Diagnosis not present

## 2019-12-21 DIAGNOSIS — I200 Unstable angina: Secondary | ICD-10-CM | POA: Diagnosis not present

## 2019-12-21 DIAGNOSIS — I5032 Chronic diastolic (congestive) heart failure: Secondary | ICD-10-CM | POA: Diagnosis not present

## 2019-12-21 DIAGNOSIS — E1165 Type 2 diabetes mellitus with hyperglycemia: Secondary | ICD-10-CM | POA: Diagnosis not present

## 2019-12-21 DIAGNOSIS — E1169 Type 2 diabetes mellitus with other specified complication: Secondary | ICD-10-CM | POA: Diagnosis not present

## 2019-12-21 DIAGNOSIS — I10 Essential (primary) hypertension: Secondary | ICD-10-CM | POA: Diagnosis not present

## 2019-12-21 DIAGNOSIS — E782 Mixed hyperlipidemia: Secondary | ICD-10-CM | POA: Diagnosis not present

## 2020-01-03 ENCOUNTER — Other Ambulatory Visit: Payer: Self-pay

## 2020-01-03 ENCOUNTER — Emergency Department: Payer: Medicare HMO

## 2020-01-03 DIAGNOSIS — Z20822 Contact with and (suspected) exposure to covid-19: Secondary | ICD-10-CM | POA: Insufficient documentation

## 2020-01-03 DIAGNOSIS — I208 Other forms of angina pectoris: Secondary | ICD-10-CM | POA: Diagnosis not present

## 2020-01-03 DIAGNOSIS — Z7984 Long term (current) use of oral hypoglycemic drugs: Secondary | ICD-10-CM | POA: Diagnosis not present

## 2020-01-03 DIAGNOSIS — E119 Type 2 diabetes mellitus without complications: Secondary | ICD-10-CM | POA: Insufficient documentation

## 2020-01-03 DIAGNOSIS — R0602 Shortness of breath: Secondary | ICD-10-CM | POA: Insufficient documentation

## 2020-01-03 DIAGNOSIS — I10 Essential (primary) hypertension: Secondary | ICD-10-CM | POA: Insufficient documentation

## 2020-01-03 DIAGNOSIS — Z79899 Other long term (current) drug therapy: Secondary | ICD-10-CM | POA: Diagnosis not present

## 2020-01-03 DIAGNOSIS — R079 Chest pain, unspecified: Secondary | ICD-10-CM | POA: Diagnosis not present

## 2020-01-03 DIAGNOSIS — I200 Unstable angina: Secondary | ICD-10-CM | POA: Diagnosis not present

## 2020-01-03 LAB — CBC
HCT: 37.3 % — ABNORMAL LOW (ref 39.0–52.0)
Hemoglobin: 12.6 g/dL — ABNORMAL LOW (ref 13.0–17.0)
MCH: 29.6 pg (ref 26.0–34.0)
MCHC: 33.8 g/dL (ref 30.0–36.0)
MCV: 87.6 fL (ref 80.0–100.0)
Platelets: 191 10*3/uL (ref 150–400)
RBC: 4.26 MIL/uL (ref 4.22–5.81)
RDW: 13.9 % (ref 11.5–15.5)
WBC: 4.9 10*3/uL (ref 4.0–10.5)
nRBC: 0 % (ref 0.0–0.2)

## 2020-01-03 LAB — BASIC METABOLIC PANEL
Anion gap: 10 (ref 5–15)
BUN: 17 mg/dL (ref 8–23)
CO2: 25 mmol/L (ref 22–32)
Calcium: 9.1 mg/dL (ref 8.9–10.3)
Chloride: 106 mmol/L (ref 98–111)
Creatinine, Ser: 0.93 mg/dL (ref 0.61–1.24)
GFR calc Af Amer: >60 mL/min (ref >60)
GFR calc non Af Amer: >60 mL/min (ref >60)
Glucose, Bld: 88 mg/dL (ref 70–99)
Potassium: 4.2 mmol/L (ref 3.5–5.1)
Sodium: 141 mmol/L (ref 135–145)

## 2020-01-03 LAB — TROPONIN I (HIGH SENSITIVITY)
Troponin I (High Sensitivity): 7 ng/L (ref <18)
Troponin I (High Sensitivity): 6 ng/L (ref <18)

## 2020-01-03 NOTE — ED Triage Notes (Signed)
Reports chest pain ongoing for a week, worsening yesterday and into today. Reports pain to center of chest. SOB with movement. Pt alert and oriented X4, cooperative, RR even and unlabored, color WNL. Pt in NAD.

## 2020-01-04 ENCOUNTER — Observation Stay
Admission: EM | Admit: 2020-01-04 | Discharge: 2020-01-06 | Disposition: A | Discharge status: Home / Self Care | Payer: Medicare HMO | Attending: Internal Medicine | Admitting: Internal Medicine

## 2020-01-04 ENCOUNTER — Observation Stay (HOSPITAL_BASED_OUTPATIENT_CLINIC_OR_DEPARTMENT_OTHER)
Admit: 2020-01-04 | Discharge: 2020-01-04 | Disposition: A | Discharge status: Home / Self Care | Payer: Medicare HMO | Attending: Internal Medicine | Admitting: Internal Medicine

## 2020-01-04 ENCOUNTER — Encounter: Payer: Self-pay | Admitting: Internal Medicine

## 2020-01-04 DIAGNOSIS — E118 Type 2 diabetes mellitus with unspecified complications: Secondary | ICD-10-CM | POA: Diagnosis present

## 2020-01-04 DIAGNOSIS — I200 Unstable angina: Secondary | ICD-10-CM

## 2020-01-04 DIAGNOSIS — F32A Depression, unspecified: Secondary | ICD-10-CM | POA: Diagnosis not present

## 2020-01-04 DIAGNOSIS — R0602 Shortness of breath: Secondary | ICD-10-CM | POA: Diagnosis not present

## 2020-01-04 DIAGNOSIS — I208 Other forms of angina pectoris: Secondary | ICD-10-CM | POA: Diagnosis present

## 2020-01-04 DIAGNOSIS — I2089 Other forms of angina pectoris: Secondary | ICD-10-CM | POA: Diagnosis present

## 2020-01-04 DIAGNOSIS — G4733 Obstructive sleep apnea (adult) (pediatric): Secondary | ICD-10-CM

## 2020-01-04 DIAGNOSIS — R079 Chest pain, unspecified: Secondary | ICD-10-CM

## 2020-01-04 DIAGNOSIS — J811 Chronic pulmonary edema: Secondary | ICD-10-CM | POA: Diagnosis not present

## 2020-01-04 DIAGNOSIS — Z9989 Dependence on other enabling machines and devices: Secondary | ICD-10-CM

## 2020-01-04 DIAGNOSIS — I10 Essential (primary) hypertension: Secondary | ICD-10-CM | POA: Diagnosis not present

## 2020-01-04 DIAGNOSIS — R69 Illness, unspecified: Secondary | ICD-10-CM | POA: Diagnosis not present

## 2020-01-04 HISTORY — DX: Atherosclerotic heart disease of native coronary artery without angina pectoris: I25.10

## 2020-01-04 LAB — LIPID PANEL
Cholesterol: 120 mg/dL (ref 0–200)
HDL: 21 mg/dL — ABNORMAL LOW (ref >40)
LDL Cholesterol: 64 mg/dL (ref 0–99)
Total CHOL/HDL Ratio: 5.7 RATIO
Triglycerides: 177 mg/dL — ABNORMAL HIGH (ref <150)
VLDL: 35 mg/dL (ref 0–40)

## 2020-01-04 LAB — ECHOCARDIOGRAM COMPLETE
AR max vel: 2.7 cm2
AV Area VTI: 2.55 cm2
AV Area mean vel: 2.69 cm2
AV Mean grad: 3 mmHg
AV Peak grad: 5.5 mmHg
Ao pk vel: 1.18 m/s
Area-P 1/2: 3.08 cm2
S' Lateral: 3.16 cm

## 2020-01-04 LAB — RESPIRATORY PANEL BY RT PCR (FLU A&B, COVID)
Influenza A by PCR: NEGATIVE
Influenza B by PCR: NEGATIVE
SARS Coronavirus 2 by RT PCR: NEGATIVE

## 2020-01-04 LAB — GLUCOSE, CAPILLARY: Glucose-Capillary: 102 mg/dL — ABNORMAL HIGH (ref 70–99)

## 2020-01-04 LAB — TROPONIN I (HIGH SENSITIVITY): Troponin I (High Sensitivity): 8 ng/L (ref <18)

## 2020-01-04 MED ORDER — SERTRALINE HCL 50 MG PO TABS
100.0000 mg | ORAL_TABLET | Freq: Every day | ORAL | Status: DC
  Administered 2020-01-04 – 2020-01-05 (×2): 100 mg via ORAL
  Filled 2020-01-04 (×2): qty 2

## 2020-01-04 MED ORDER — ENOXAPARIN SODIUM 40 MG/0.4ML ~~LOC~~ SOLN
40.0000 mg | SUBCUTANEOUS | Status: DC
  Administered 2020-01-04 – 2020-01-05 (×2): 40 mg via SUBCUTANEOUS
  Filled 2020-01-04 (×2): qty 0.4

## 2020-01-04 MED ORDER — ASPIRIN EC 81 MG PO TBEC
81.0000 mg | DELAYED_RELEASE_TABLET | Freq: Every day | ORAL | Status: DC
  Administered 2020-01-05 – 2020-01-06 (×2): 81 mg via ORAL
  Filled 2020-01-04 (×2): qty 1

## 2020-01-04 MED ORDER — ACETAMINOPHEN 325 MG PO TABS: 650.0000 mg | ORAL_TABLET | ORAL | Status: DC | PRN

## 2020-01-04 MED ORDER — ASPIRIN 81 MG PO CHEW
324.0000 mg | CHEWABLE_TABLET | Freq: Once | ORAL | Status: AC
  Administered 2020-01-04: 324 mg via ORAL
  Filled 2020-01-04: qty 4

## 2020-01-04 MED ORDER — LISINOPRIL 20 MG PO TABS
20.0000 mg | ORAL_TABLET | Freq: Every day | ORAL | Status: DC
  Administered 2020-01-04 – 2020-01-05 (×2): 20 mg via ORAL
  Filled 2020-01-04 (×2): qty 1

## 2020-01-04 MED ORDER — NITROGLYCERIN 0.4 MG SL SUBL
0.4000 mg | SUBLINGUAL_TABLET | SUBLINGUAL | Status: DC | PRN
  Administered 2020-01-04: 0.4 mg via SUBLINGUAL
  Filled 2020-01-04 (×2): qty 1

## 2020-01-04 MED ORDER — ONDANSETRON HCL 4 MG/2ML IJ SOLN: 4.0000 mg | Freq: Four times a day (QID) | INTRAMUSCULAR | Status: DC | PRN

## 2020-01-04 MED ORDER — SIMVASTATIN 20 MG PO TABS
40.0000 mg | ORAL_TABLET | Freq: Every evening | ORAL | Status: DC
  Administered 2020-01-05: 40 mg via ORAL
  Filled 2020-01-04: qty 2

## 2020-01-04 MED ORDER — FLUTICASONE PROPIONATE 50 MCG/ACT NA SUSP
1.0000 | Freq: Every day | NASAL | Status: DC
  Administered 2020-01-05 – 2020-01-06 (×2): 1 via NASAL
  Filled 2020-01-04: qty 16

## 2020-01-04 MED ORDER — FUROSEMIDE 10 MG/ML IJ SOLN
40.0000 mg | Freq: Once | INTRAMUSCULAR | Status: AC
  Administered 2020-01-04: 40 mg via INTRAVENOUS
  Filled 2020-01-04: qty 4

## 2020-01-04 NOTE — Plan of Care (Signed)
  Problem: Education: Goal: Knowledge of [facility] Education information/materials will improve Outcome: Progressing Goal: Emotional status will improve Outcome: Progressing Goal: Mental status will improve Outcome: Progressing Goal: Verbalization of understanding the information provided will improve Outcome: Progressing   Problem: Activity: Goal: Interest or engagement in activities will improve Outcome: Progressing Goal: Sleeping patterns will improve Outcome: Progressing   

## 2020-01-04 NOTE — ED Provider Notes (Signed)
[facility] Emergency Department Provider Note  ____________________________________________   First MD Initiated Contact with Patient 01/04/20 [phone]     (approximate)  I have reviewed the triage vital signs and the nursing notes.   HISTORY  Chief Complaint Chest Pain    HPI Christopher Hernandez is a 74 y.o. male  With PMHx HTN, DM, HLD, here with CP with exertion. Pt reports that for the past week, he has noticed increasingly severe SOB, dull CP with exertion. Pain was initially with significant exertion only but now is essentially with any movement. He feels a dull chest pressure that resolves with rest. No fever, chills. No other complaints. No CP at rest.    He has associated diaphoresis that also resolves with rest.  No known history of coronary disease.  Denies any prior stents.  No current symptoms at rest.       Past Medical History:  Diagnosis Date  . BMI 40.0-44.9, adult ([place])   . Chest pain at rest   . Diabetes mellitus   . Hyperlipemia   . Hypertension   . OSA on CPAP   . Peptic ulcer disease   . Recurrent major depressive disorder ([place])   . Rhinitis, allergic   . Routine general medical examination at a health care facility   . Xerosis of skin     Patient Active Problem List   Diagnosis Date Noted  . Angina of effort ([place]) 01/04/2020  . Precordial chest pain   . Hyperlipidemia LDL goal <100 03/21/2017  . Type 2 diabetes mellitus with complication, without long-term current use of insulin ([place]) 03/21/2017    Past Surgical History:  Procedure Laterality Date  . LEFT HEART CATH AND CORONARY ANGIOGRAPHY N/A 03/24/2017   Procedure: LEFT HEART CATH AND CORONARY ANGIOGRAPHY;  Surgeon: Jettie Booze, MD;  Location: [facility];  Service: Cardiovascular;  Laterality: N/A;    Prior to Admission medications   Medication Sig Start Date End Date Taking? Authorizing Provider  aspirin 81 MG tablet Take 81 mg by mouth daily.     [provider]  fluticasone (FLONASE) 50 MCG/ACT nasal spray Place into both nostrils daily.    [provider]  glimepiride (AMARYL) 2 MG tablet Take 2 mg by mouth every morning. 05/23/19   [provider]  lisinopril (ZESTRIL) 20 MG tablet Take 20 mg by mouth at bedtime. 05/23/19   [provider]  metFORMIN (GLUCOPHAGE) 1000 MG tablet Take 1,000 mg by mouth every other day.    [provider]  pioglitazone (ACTOS) 30 MG tablet Take 30 mg by mouth daily.     [provider]  potassium chloride SA (K-DUR) 20 MEQ tablet Take 1 tablet (20 mEq total) by mouth daily as needed (Only take with lasix). Please make overdue appt with Dr. Irish Lack before anymore refills. 1st attempt 10/24/18 06/01/19  Jettie Booze, MD  sertraline (ZOLOFT) 100 MG tablet Take 100 mg by mouth at bedtime.     [provider]  simvastatin (ZOCOR) 40 MG tablet Take 40 mg by mouth every evening.    [provider]    Allergies Patient has no known allergies.  Family History  Problem Relation Age of Onset  . Cirrhosis Father   . Liver disease Father   . Hypertension Mother   . Diabetes Mellitus I Mother   . Colon cancer Neg Hx   . Prostate cancer Neg Hx   . Testicular cancer Neg Hx  Social History Social History   Tobacco Use  . Smoking status: Never Smoker  . Smokeless tobacco: Never Used  . Tobacco comment: TRIED AS A TEENAGER  Substance Use Topics  . Alcohol use: No  . Drug use: No    Review of Systems  Review of Systems  Constitutional: Negative for chills, fatigue and fever.  HENT: Negative for sore throat.   Respiratory: Positive for chest tightness and shortness of breath.   Cardiovascular: Positive for chest pain.  Gastrointestinal: Negative for abdominal pain.  Genitourinary: Negative for flank pain.  Musculoskeletal: Negative for neck pain.  Skin: Negative for rash and wound.  Allergic/Immunologic: Negative for  immunocompromised state.  Neurological: Negative for weakness and numbness.  Hematological: Does not bruise/bleed easily.  All other systems reviewed and are negative.    ____________________________________________  PHYSICAL EXAM:      VITAL SIGNS: ED Triage Vitals  Enc Vitals Group     BP 01/03/20 1819 (!) 127/47     Pulse Rate 01/03/20 1819 66     Resp 01/03/20 1819 18     Temp 01/03/20 1819 98.5 F (36.9 C)     Temp Source 01/03/20 1819 Oral     SpO2 01/03/20 1819 97 %     Weight 01/03/20 1820 178 lb (80.7 kg)     Height 01/03/20 1820 5\' 9"  (1.753 m)     Head Circumference --      Peak Flow --      Pain Score 01/03/20 1819 4     Pain Loc --      Pain Edu? --      Excl. in [place]? --      Physical Exam Vitals and nursing note reviewed.  Constitutional:      General: He is not in acute distress.    Appearance: He is well-developed.  HENT:     Head: Normocephalic and atraumatic.  Eyes:     Conjunctiva/sclera: Conjunctivae normal.  Cardiovascular:     Rate and Rhythm: Normal rate and regular rhythm.     Heart sounds: Normal heart sounds. No murmur heard.  No friction rub.  Pulmonary:     Effort: Pulmonary effort is normal. No respiratory distress.     Breath sounds: Normal breath sounds. No wheezing or rales.  Abdominal:     General: There is no distension.     Palpations: Abdomen is soft.     Tenderness: There is no abdominal tenderness.  Musculoskeletal:     Cervical back: Neck supple.  Skin:    General: Skin is warm.     Capillary Refill: Capillary refill takes less than 2 seconds.  Neurological:     Mental Status: He is alert and oriented to person, place, and time.     Motor: No abnormal muscle tone.       ____________________________________________   LABS (all labs ordered are listed, but only abnormal results are displayed)  Labs Reviewed  CBC - Abnormal; Notable for the following components:      Result Value   Hemoglobin 12.6 (*)    HCT  37.3 (*)    All other components within normal limits  BASIC METABOLIC PANEL  TROPONIN I (HIGH SENSITIVITY)  TROPONIN I (HIGH SENSITIVITY)    ____________________________________________  EKG: Normal sinus rhythm, ventricular rate 74.  PR 202, QRS 128, QTc 432.  No acute ST elevations or depressions.  Right bundle branch block, no acute elevations. ________________________________________  RADIOLOGY All imaging, including plain films, CT scans,  and ultrasounds, independently reviewed by me, and interpretations confirmed via formal radiology reads.  ED MD interpretation:   Chest x-ray: Negative  Official radiology report(s): DG Chest 2 View  Result Date: 01/03/2020 CLINICAL DATA:  Chest pain EXAM: CHEST - 2 VIEW COMPARISON:  None. FINDINGS: The heart size and mediastinal contours are within normal limits. Both lungs are clear. The visualized skeletal structures are unremarkable. IMPRESSION: No active cardiopulmonary disease. Electronically Signed   By: Ulyses Jarred M.D.   On: 01/03/2020 18:49    ____________________________________________  PROCEDURES   Procedure(s) performed (including Critical Care):  Procedures  ____________________________________________  INITIAL IMPRESSION / MDM / [place] / ED COURSE  As part of my medical decision making, I reviewed the following data within the [place] notes reviewed and incorporated, Old chart reviewed, Notes from prior ED visits, and [place] Controlled Substance Database       *Christopher Hernandez was evaluated in Emergency Department on 01/04/2020 for the symptoms described in the history of present illness. He was evaluated in the context of the global COVID-19 pandemic, which necessitated consideration that the patient might be at risk for infection with the SARS-CoV-2 virus that causes COVID-19. Institutional protocols and algorithms that pertain to the evaluation of patients at risk for COVID-19  are in a state of rapid change based on information released by regulatory bodies including the CDC and federal and state organizations. These policies and algorithms were followed during the patient's care in the ED.  Some ED evaluations and interventions may be delayed as a result of limited staffing during the pandemic.*     Medical Decision Making: 73 year old male here with story concerning for unstable angina.  EKG reviewed by me and is nonischemic currently.  Troponins are negative and he has no pain at rest, but does have increasingly severe pain with less and less exertion, concerning for unstable component.  EKG shows a bundle branch block, unchanged.  Chest x-ray reviewed by me and is negative.  Lab work is otherwise reassuring.  Will admit for high risk chest pain with high risk heart score.  Aspirin given.  ____________________________________________  FINAL CLINICAL IMPRESSION(S) / ED DIAGNOSES  Final diagnoses:  Unstable angina (HCC)     MEDICATIONS GIVEN DURING THIS VISIT:  Medications  nitroGLYCERIN (NITROSTAT) SL tablet 0.4 mg (0.4 mg Sublingual Given 01/04/20 1040)  aspirin chewable tablet 324 mg (324 mg Oral Given 01/04/20 1039)     ED Discharge Orders    None       Note:  This document was prepared using Dragon voice recognition software and may include unintentional dictation errors.   Duffy Bruce, MD 01/04/20 1108

## 2020-01-04 NOTE — ED Notes (Signed)
Pt taking via transporter

## 2020-01-04 NOTE — H&P (Signed)
History and Physical    Christopher Hernandez TKZ:[id number] DOB: 06-Aug-1945 DOA: 01/04/2020  PCP: [name], MD   Patient coming from: Home  I have personally briefly reviewed patient's old medical records in [place]  Chief Complaint: Chest pain  HPI: Christopher Hernandez is a 74 y.o. male with medical history significant for diabetes mellitus, hypertension and dyslipidemia who presents to the ER for evaluation of chest pain and  shortness of breath with exertion.  Patient states that he has had symptoms for the last 1 month but over the past 1 week he has become more noticeable and more frequent.  Chest pain is mostly over the left anterior chest wall, because with exertion and radiates to his neck.  It is associated with shortness of breath and diaphoresis.  Chest pain or shortness of breath resolve at rest.  He denies having any nausea, no vomiting, no palpitations, no abdominal pain, no changes in his bowel habits or urinary symptoms. Labs show sodium 141, potassium 4.2, chloride 106, bicarb 25, glucose 88, BUN 17, creatinine 0.93, calcium 9.1, troponin 7, white count 12.9, hemoglobin 12.6, hematocrit 37.3, MCV 87.6, RDW 13.9, platelet count 191, Respiratory viral panel is negative Chest x-ray reviewed by me shows no acute pulmonary disease Twelve-lead EKG shows sinus rhythm, right bundle branch block with PVCs   ED Course: Patient is a 74 year old Caucasian male with a past medical history significant for morbid obesity, diabetes mellitus, hypertension and sleep apnea presents to the ER for evaluation of chest pain and shortness of breath mostly with exertion with radiation to his neck and associated with diaphoresis.  His symptoms are resolved at rest.  He will be referred to observation status for further evaluation   Review of Systems: As per HPI otherwise 10 point review of systems negative.    Past Medical History:  Diagnosis Date  . BMI 40.0-44.9, adult ([place])   . Chest pain  at rest   . Diabetes mellitus   . Hyperlipemia   . Hypertension   . OSA on CPAP   . Peptic ulcer disease   . Recurrent major depressive disorder ([place])   . Rhinitis, allergic   . Routine general medical examination at a health care facility   . Xerosis of skin     Past Surgical History:  Procedure Laterality Date  . LEFT HEART CATH AND CORONARY ANGIOGRAPHY N/A 03/24/2017   Procedure: LEFT HEART CATH AND CORONARY ANGIOGRAPHY;  Surgeon: Jettie Booze, MD;  Location: [facility];  Service: Cardiovascular;  Laterality: N/A;     reports that he has never smoked. He has never used smokeless tobacco. He reports that he does not drink alcohol and does not use drugs.  No Known Allergies  Family History  Problem Relation Age of Onset  . Cirrhosis Father   . Liver disease Father   . Hypertension Mother   . Diabetes Mellitus I Mother   . Colon cancer Neg Hx   . Prostate cancer Neg Hx   . Testicular cancer Neg Hx      Prior to Admission medications   Medication Sig Start Date End Date Taking? Authorizing Provider  aspirin 81 MG tablet Take 81 mg by mouth daily.    [provider]  fluticasone (FLONASE) 50 MCG/ACT nasal spray Place into both nostrils daily.    [provider]  glimepiride (AMARYL) 2 MG tablet Take 2 mg by mouth every morning. 05/23/19   [provider]  lisinopril (  ZESTRIL) 20 MG tablet Take 20 mg by mouth at bedtime. 05/23/19   [provider]  metFORMIN (GLUCOPHAGE) 1000 MG tablet Take 1,000 mg by mouth every other day.    [provider]  pioglitazone (ACTOS) 30 MG tablet Take 30 mg by mouth daily.     [provider]  potassium chloride SA (K-DUR) 20 MEQ tablet Take 1 tablet (20 mEq total) by mouth daily as needed (Only take with lasix). Please make overdue appt with Dr. Irish Lack before anymore refills. 1st attempt 10/24/18 06/01/19  Jettie Booze, MD  sertraline (ZOLOFT) 100 MG tablet Take 100 mg  by mouth at bedtime.     [provider]  simvastatin (ZOCOR) 40 MG tablet Take 40 mg by mouth every evening.    [provider]    Physical Exam: Vitals:   01/03/20 1819 01/03/20 1820 01/04/20 0542 01/04/20 1000  BP: (!) 127/47  137/68 135/77  Pulse: 66  73 68  Resp: 18  20 (!) 24  Temp: 98.5 F (36.9 C)  98.5 F (36.9 C)   TempSrc: Oral  Oral   SpO2: 97%  97% 99%  Weight:  80.7 kg    Height:  5\' 9"  (1.753 m)       Vitals:   01/03/20 1819 01/03/20 1820 01/04/20 0542 01/04/20 1000  BP: (!) 127/47  137/68 135/77  Pulse: 66  73 68  Resp: 18  20 (!) 24  Temp: 98.5 F (36.9 C)  98.5 F (36.9 C)   TempSrc: Oral  Oral   SpO2: 97%  97% 99%  Weight:  80.7 kg    Height:  5\' 9"  (1.753 m)      Constitutional: NAD, alert and oriented x 3  Eyes: PERRL, lids and conjunctivae normal ENMT: Mucous membranes are moist.  Neck: normal, supple, no masses, no thyromegaly Respiratory: clear to auscultation bilaterally, no wheezing, no crackles. Normal respiratory effort. No accessory muscle use.  Cardiovascular: Regular rate and rhythm, no murmurs / rubs / gallops. No extremity edema. 2+ pedal pulses. No carotid bruits.  Abdomen: no tenderness, no masses palpated. No hepatosplenomegaly. Bowel sounds positive.  Central adiposity Musculoskeletal: no clubbing / cyanosis. No joint deformity upper and lower extremities.  Skin: no rashes, lesions, ulcers.  Neurologic: No gross focal neurologic deficit. Psychiatric: Normal mood and affect.   Labs on Admission: I have personally reviewed following labs and imaging studies  CBC: Recent Labs  Lab 01/03/20 1821  WBC 4.9  HGB 12.6*  HCT 37.3*  MCV 87.6  PLT 782   Basic Metabolic Panel: Recent Labs  Lab 01/03/20 1821  NA 141  K 4.2  CL 106  CO2 25  GLUCOSE 88  BUN 17  CREATININE 0.93  CALCIUM 9.1   GFR: Estimated Creatinine Clearance: 69.7 mL/min (by C-G formula based on SCr of 0.93 mg/dL). Liver Function  Tests: No results for input(s): AST, ALT, ALKPHOS, BILITOT, PROT, ALBUMIN in the last 168 hours. No results for input(s): LIPASE, AMYLASE in the last 168 hours. No results for input(s): AMMONIA in the last 168 hours. Coagulation Profile: No results for input(s): INR, PROTIME in the last 168 hours. Cardiac Enzymes: No results for input(s): CKTOTAL, CKMB, CKMBINDEX, TROPONINI in the last 168 hours. BNP (last 3 results) No results for input(s): PROBNP in the last 8760 hours. HbA1C: No results for input(s): HGBA1C in the last 72 hours. CBG: No results for input(s): GLUCAP in the last 168 hours. Lipid Profile: No results for input(s):  CHOL, HDL, LDLCALC, TRIG, CHOLHDL, LDLDIRECT in the last 72 hours. Thyroid Function Tests: No results for input(s): TSH, T4TOTAL, FREET4, T3FREE, THYROIDAB in the last 72 hours. Anemia Panel: No results for input(s): VITAMINB12, FOLATE, FERRITIN, TIBC, IRON, RETICCTPCT in the last 72 hours. Urine analysis: No results found for: COLORURINE, APPEARANCEUR, LABSPEC, PHURINE, GLUCOSEU, HGBUR, BILIRUBINUR, KETONESUR, PROTEINUR, UROBILINOGEN, NITRITE, LEUKOCYTESUR  Radiological Exams on Admission: DG Chest 2 View  Result Date: 01/03/2020 CLINICAL DATA:  Chest pain EXAM: CHEST - 2 VIEW COMPARISON:  None. FINDINGS: The heart size and mediastinal contours are within normal limits. Both lungs are clear. The visualized skeletal structures are unremarkable. IMPRESSION: No active cardiopulmonary disease. Electronically Signed   By: Ulyses Jarred M.D.   On: 01/03/2020 18:49    EKG: Independently reviewed.  Sinus rhythm Right bundle branch block with PVCs  Assessment/Plan Principal Problem:   Angina of effort [facility]) Active Problems:   Type 2 diabetes mellitus with complication, without long-term current use of insulin (HCC)   OSA on CPAP   Hypertension   Depression     Effort angina Patient with multiple cardiac risk factors which include diabetes, hypertension  who presents to the ER for evaluation of chest pain or shortness of breath mostly with exertion with radiation to the neck. His symptoms are resolved at rest We will obtain serial cardiac enzymes We will obtain 2D echocardiogram to assess LVEF and rule out regional wall motion abnormality We will request cardiology consult  Continue aspirin and statins    Type 2 diabetes mellitus On oral hypoglycemic agents Place patient on consistent carbohydrate diet Sliding scale coverage   Depression Continue sertraline   Hypertension Continue lisinopril   DVT prophylaxis: Lovenox Code Status: Full code Family Communication: Greater than 50% of time was spent discussing plan of care with patient at the bedside.  All questions and concerns have been addressed.  He verbalizes understanding and agrees with the plan. Disposition Plan: Back to previous home environment Consults called: Cardiology    Collier Bullock MD [facility]     01/04/2020, 12:35 PM

## 2020-01-04 NOTE — Consult Note (Signed)
Cardiology Consultation:   Patient ID: [id number] PRINTUP; [id number]; 1946/01/20   Admit date: 01/04/2020 Date of Consult: 01/04/2020  Primary Care Provider: [name], MD Primary Cardiologist: Irish Lack Primary Electrophysiologist:  None   Patient Profile:   Christopher Hernandez is a 74 y.o. male with a hx of nonobstructive CAD by [place] in 03/2017 as detailed below, HFpEF, DM2, HTN, HLD, and obesity with OSA on CPAP who is being seen today for the evaluation of chest pain/dyspnea at the request of Dr. Francine Graven.  History of Present Illness:   Christopher Hernandez underwent diagnostic LHC in 03/2017 showed nonobstructive CAD with 10 to 25% stenosis in the LAD, LCx, and RCA. EF was normal at 55 to 60%. LVEDP was mildly elevated. Following this, he was started on Lasix for volume overload. He was last seen in the office in 05/2019 and was doing well from a cardiac perspective.   Over the past several weeks he has noted worsening exertional dyspnea and chest tightness which has become progressively worse over the past 2 weeks.  With this, he has noted increased fatigue.  Chest pain is along the left anterior chest wall and without radiation.  Symptoms improved with rest.  Symptoms are similar to his presentation in 2018.  No associated lower extremity swelling, abdominal distention, orthopnea, PND, early satiety.  No diaphoresis, dizziness, presyncope, or syncope.  Upon his arrival to [facility] he was noted to have stable vital signs. Initial high-sensitivity troponin 7 with a delta of 6. EKG showed sinus rhythm, 74 bpm, right bundle branch block (known), rare PVC, no acute ST-T changes. Chest x-ray showed no acute cardiopulmonary process. In the ED he was given ASA 324 mg x 1 and SL NTG x1. Upon admission by hospitalist service cardiology was consulted for further recommendations. Currently, asymptomatic.   Past Medical History:  Diagnosis Date  . BMI 40.0-44.9, adult ([place])   . Chest pain at rest   .  Coronary artery disease, non-occlusive   . Diabetes mellitus   . Hyperlipemia   . Hypertension   . OSA on CPAP   . Peptic ulcer disease   . Recurrent major depressive disorder ([place])   . Rhinitis, allergic   . Routine general medical examination at a health care facility   . Xerosis of skin     Past Surgical History:  Procedure Laterality Date  . LEFT HEART CATH AND CORONARY ANGIOGRAPHY N/A 03/24/2017   Procedure: LEFT HEART CATH AND CORONARY ANGIOGRAPHY;  Surgeon: Jettie Booze, MD;  Location: [facility] [facility];  Service: Cardiovascular;  Laterality: N/A;     Home Meds: Prior to Admission medications   Medication Sig Start Date End Date Taking? Authorizing Provider  aspirin 81 MG tablet Take 81 mg by mouth daily.    [provider]  fluticasone (FLONASE) 50 MCG/ACT nasal spray Place into both nostrils daily.    [provider]  glimepiride (AMARYL) 2 MG tablet Take 2 mg by mouth every morning. 05/23/19   [provider]  lisinopril (ZESTRIL) 20 MG tablet Take 20 mg by mouth at bedtime. 05/23/19   [provider]  metFORMIN (GLUCOPHAGE) 1000 MG tablet Take 1,000 mg by mouth every other day.    [provider]  pioglitazone (ACTOS) 30 MG tablet Take 30 mg by mouth daily.     [provider]  potassium chloride SA (K-DUR) 20 MEQ tablet Take 1 tablet (20 mEq total) by mouth daily as needed (Only take with  lasix). Please make overdue appt with Dr. Irish Lack before anymore refills. 1st attempt 10/24/18 06/01/19  Jettie Booze, MD  sertraline (ZOLOFT) 100 MG tablet Take 100 mg by mouth at bedtime.     [provider]  simvastatin (ZOCOR) 40 MG tablet Take 40 mg by mouth every evening.    [provider]    Inpatient Medications: Scheduled Meds: . aspirin EC  81 mg Oral Daily  . enoxaparin (LOVENOX) injection  40 mg Subcutaneous Q24H  . fluticasone  1 spray Each Nare Daily  . lisinopril  20 mg Oral QHS  .  sertraline  100 mg Oral QHS  . simvastatin  40 mg Oral QPM   Continuous Infusions:  PRN Meds: acetaminophen, nitroGLYCERIN, ondansetron (ZOFRAN) IV  Allergies:  No Known Allergies  Social History:   Social History   Socioeconomic History  . Marital status: Married    Spouse name: Not on file  . Number of children: Not on file  . Years of education: Not on file  . Highest education level: Not on file  Occupational History  . Not on file  Tobacco Use  . Smoking status: Never Smoker  . Smokeless tobacco: Never Used  . Tobacco comment: TRIED AS A TEENAGER  Substance and Sexual Activity  . Alcohol use: No  . Drug use: No  . Sexual activity: Not on file  Other Topics Concern  . Not on file  Social History Narrative  . Not on file   Social Determinants of Health   Financial Resource Strain:   . Difficulty of Paying Living Expenses: Not on file  Food Insecurity:   . Worried About Charity fundraiser in the Last Year: Not on file  . Ran Out of Food in the Last Year: Not on file  Transportation Needs:   . Lack of Transportation (Medical): Not on file  . Lack of Transportation (Non-Medical): Not on file  Physical Activity:   . Days of Exercise per Week: Not on file  . Minutes of Exercise per Session: Not on file  Stress:   . Feeling of Stress : Not on file  Social Connections:   . Frequency of Communication with Friends and Family: Not on file  . Frequency of Social Gatherings with Friends and Family: Not on file  . Attends Religious Services: Not on file  . Active Member of Clubs or Organizations: Not on file  . Attends Archivist Meetings: Not on file  . Marital Status: Not on file  Intimate Partner Violence:   . Fear of Current or Ex-Partner: Not on file  . Emotionally Abused: Not on file  . Physically Abused: Not on file  . Sexually Abused: Not on file     Family History:   Family History  Problem Relation Age of Onset  . Cirrhosis Father   .  Liver disease Father   . Hypertension Mother   . Diabetes Mellitus I Mother   . Colon cancer Neg Hx   . Prostate cancer Neg Hx   . Testicular cancer Neg Hx     ROS:  Review of Systems  Constitutional: Positive for malaise/fatigue. Negative for chills, diaphoresis, fever and weight loss.  HENT: Negative for congestion.   Eyes: Negative for discharge and redness.  Respiratory: Positive for shortness of breath. Negative for cough, sputum production and wheezing.   Cardiovascular: Positive for chest pain. Negative for palpitations, orthopnea, claudication, leg swelling and PND.  Gastrointestinal: Negative for abdominal pain, blood  in stool, heartburn, melena, nausea and vomiting.  Musculoskeletal: Negative for falls and myalgias.  Skin: Negative for rash.  Neurological: Negative for dizziness, tingling, tremors, sensory change, speech change, focal weakness, loss of consciousness and weakness.  Endo/Heme/Allergies: Does not bruise/bleed easily.  Psychiatric/Behavioral: Negative for substance abuse. The patient is not nervous/anxious.   All other systems reviewed and are negative.     Physical Exam/Data:   Vitals:   01/03/20 1819 01/03/20 1820 01/04/20 0542 01/04/20 1000  BP: (!) 127/47  137/68 135/77  Pulse: 66  73 68  Resp: 18  20 (!) 24  Temp: 98.5 F (36.9 C)  98.5 F (36.9 C)   TempSrc: Oral  Oral   SpO2: 97%  97% 99%  Weight:  80.7 kg    Height:  5\' 9"  (1.753 m)     No intake or output data in the 24 hours ending 01/04/20 1306 Filed Weights   01/03/20 1820  Weight: 80.7 kg   Body mass index is 26.29 kg/m.   Physical Exam: General: Well developed, well nourished, in no acute distress. Head: Normocephalic, atraumatic, sclera non-icteric, no xanthomas, nares without discharge.  Neck: Negative for carotid bruits. JVD not elevated. Lungs: Clear bilaterally to auscultation without wheezes, rales, or rhonchi. Breathing is unlabored. Heart: RRR with S1 S2. No murmurs,  rubs, or gallops appreciated. Abdomen: Soft, non-tender, non-distended with normoactive bowel sounds. No hepatomegaly. No rebound/guarding. No obvious abdominal masses. Msk:  Strength and tone appear normal for age. Extremities: No clubbing or cyanosis. No edema. Distal pedal pulses are 2+ and equal bilaterally. Neuro: Alert and oriented X 3. No facial asymmetry. No focal deficit. Moves all extremities spontaneously. Psych:  Responds to questions appropriately with a normal affect.   EKG:  The EKG was personally reviewed and demonstrates: sinus rhythm, 74 bpm, right bundle branch block (known), rare PVC, no acute ST-T changes Telemetry:  Telemetry was personally reviewed and demonstrates: SR, 60s bpm  Weights: Filed Weights   01/03/20 1820  Weight: 80.7 kg    Relevant CV Studies:  LHC 03/2017:  Mid RCA lesion is 10% stenosed.  Mid Cx lesion is 25% stenosed.  Ost LAD to Prox LAD lesion is 10% stenosed.  Mid LAD lesion is 25% stenosed.  The left ventricular systolic function is normal.  LV end diastolic pressure is mildly elevated.  The left ventricular ejection fraction is 55-65% by visual estimate.  There is no aortic valve stenosis.   Nonobstructive coronary artery disease.  Continue preventive therapy. __________  2D echo 01/04/2020: Pending __________  Carlton Adam MPI scheduled for 01/05/2020: Pending   Laboratory Data:  Chemistry Recent Labs  Lab 01/03/20 1821  NA 141  K 4.2  CL 106  CO2 25  GLUCOSE 88  BUN 17  CREATININE 0.93  CALCIUM 9.1  GFRNONAA >60  GFRAA >60  ANIONGAP 10    No results for input(s): PROT, ALBUMIN, AST, ALT, ALKPHOS, BILITOT in the last 168 hours. Hematology Recent Labs  Lab 01/03/20 1821  WBC 4.9  RBC 4.26  HGB 12.6*  HCT 37.3*  MCV 87.6  MCH 29.6  MCHC 33.8  RDW 13.9  PLT 191   Cardiac EnzymesNo results for input(s): TROPONINI in the last 168 hours. No results for input(s): TROPIPOC in the last 168 hours.  BNPNo  results for input(s): BNP, PROBNP in the last 168 hours.  DDimer No results for input(s): DDIMER in the last 168 hours.  Radiology/Studies:  DG Chest 2 View  Result Date: 01/03/2020  IMPRESSION: No active cardiopulmonary disease. Electronically Signed   By: Ulyses Jarred M.D.   On: 01/03/2020 18:49    Assessment and Plan:   1.  Chest pain with moderate retrocardiac etiology/nonobstructive CAD: -Currently chest pain-free -High-sensitivity troponin negative x2, EKG nonacute and unchanged from baseline -Preliminary echo with preserved LVSF -Most recent nonobstructive cath, nonischemic EKG, and normal troponins ACS is unlikely -Patient prefers Lexiscan MPI which will be scheduled for 10/2 -Await echo  2.  HFpEF: -We will give a one-time dose of IV Lasix 40 mg daily  3.  Morbid obesity with physical deconditioning: -Suspect this is contributing to his overall presentation -Weight loss is advised  4.  HTN: -Blood pressure reasonably controlled -Continue current therapy   For questions or updates, please contact [place] Please consult [email or web address] for contact info under Cardiology/STEMI.   Signed, Christell Faith, PA-C [place] Pager: [phone] 01/04/2020, 1:06 PM

## 2020-01-04 NOTE — ED Notes (Signed)
Pt placed in a hospital Bed

## 2020-01-04 NOTE — Progress Notes (Signed)
*  PRELIMINARY RESULTS* Echocardiogram 2D Echocardiogram has been performed.  Sherrie Sport 01/04/2020, 1:24 PM

## 2020-01-04 NOTE — ED Notes (Signed)
Says not feeling well for couple weeks.  Shortness of breath, esp with exertion.  Also has had a couple of episodes where left leg feels numb, but it is better.  Chest pains on and off and currently is having some tightness.

## 2020-01-04 NOTE — ED Notes (Signed)
Report called , Requested transport from unit Sec.

## 2020-01-05 ENCOUNTER — Observation Stay (HOSPITAL_BASED_OUTPATIENT_CLINIC_OR_DEPARTMENT_OTHER): Payer: Medicare HMO

## 2020-01-05 DIAGNOSIS — R079 Chest pain, unspecified: Secondary | ICD-10-CM

## 2020-01-05 DIAGNOSIS — I208 Other forms of angina pectoris: Secondary | ICD-10-CM | POA: Diagnosis not present

## 2020-01-05 LAB — NM MYOCAR MULTI W/SPECT W/WALL MOTION / EF
LV dias vol: 102 mL (ref 62–150)
LV sys vol: 39 mL
Peak HR: 90 {beats}/min
Percent HR: 61 %
Rest HR: 68 {beats}/min
SDS: 0
SRS: 1
SSS: 0
TID: 0.95

## 2020-01-05 LAB — BASIC METABOLIC PANEL
Anion gap: 9 (ref 5–15)
BUN: 19 mg/dL (ref 8–23)
CO2: 28 mmol/L (ref 22–32)
Calcium: 8.9 mg/dL (ref 8.9–10.3)
Chloride: 102 mmol/L (ref 98–111)
Creatinine, Ser: 1.01 mg/dL (ref 0.61–1.24)
GFR calc Af Amer: >60 mL/min (ref >60)
GFR calc non Af Amer: >60 mL/min (ref >60)
Glucose, Bld: 123 mg/dL — ABNORMAL HIGH (ref 70–99)
Potassium: 3.9 mmol/L (ref 3.5–5.1)
Sodium: 139 mmol/L (ref 135–145)

## 2020-01-05 LAB — BRAIN NATRIURETIC PEPTIDE: B Natriuretic Peptide: 77.1 pg/mL (ref 0.0–100.0)

## 2020-01-05 LAB — GLUCOSE, CAPILLARY
Glucose-Capillary: 123 mg/dL — ABNORMAL HIGH (ref 70–99)
Glucose-Capillary: 113 mg/dL — ABNORMAL HIGH (ref 70–99)
Glucose-Capillary: 103 mg/dL — ABNORMAL HIGH (ref 70–99)
Glucose-Capillary: 105 mg/dL — ABNORMAL HIGH (ref 70–99)

## 2020-01-05 MED ORDER — TECHNETIUM TC 99M TETROFOSMIN IV KIT
30.0000 | PACK | Freq: Once | INTRAVENOUS | Status: AC | PRN
  Administered 2020-01-05: 30.78 via INTRAVENOUS

## 2020-01-05 MED ORDER — TECHNETIUM TC 99M TETROFOSMIN IV KIT
10.0000 | PACK | Freq: Once | INTRAVENOUS | Status: AC | PRN
  Administered 2020-01-05: 10.76 via INTRAVENOUS

## 2020-01-05 MED ORDER — INSULIN ASPART 100 UNIT/ML ~~LOC~~ SOLN
0.0000 [IU] | SUBCUTANEOUS | Status: DC
  Administered 2020-01-06 (×2): 2 [IU] via SUBCUTANEOUS
  Filled 2020-01-05 (×2): qty 1

## 2020-01-05 MED ORDER — REGADENOSON 0.4 MG/5ML IV SOLN
0.4000 mg | Freq: Once | INTRAVENOUS | Status: AC
  Administered 2020-01-05: 0.4 mg via INTRAVENOUS

## 2020-01-05 NOTE — Progress Notes (Signed)
Progress Note  Patient Name: Christopher Hernandez Date of Encounter: 01/05/2020  Primary Cardiologist: Irish Lack   Subjective   No further chest pain. No SOB. HS-Tn negative x 3. Echo with preserved LVSF and normal wall motion. He is for [facility] MPI today.   Inpatient Medications    Scheduled Meds: . aspirin EC  81 mg Oral Daily  . enoxaparin (LOVENOX) injection  40 mg Subcutaneous Q24H  . fluticasone  1 spray Each Nare Daily  . lisinopril  20 mg Oral QHS  . sertraline  100 mg Oral QHS  . simvastatin  40 mg Oral QPM   Continuous Infusions:  PRN Meds: acetaminophen, nitroGLYCERIN, ondansetron (ZOFRAN) IV   Vital Signs    Vitals:   01/04/20 1800 01/04/20 1815 01/04/20 1845 01/04/20 2049  BP:    135/85  Pulse: (!) 25 72 70 72  Resp: (!) 40 20 17 18   Temp:    98.8 F (37.1 C)  TempSrc:    Oral  SpO2: (!) 86% 96% 98% 100%  Weight:    121.7 kg  Height:    5\' 9"  (1.753 m)    Intake/Output Summary (Last 24 hours) at 01/05/2020 0918 Last data filed at 01/04/2020 2105 Gross per 24 hour  Intake --  Output 200 ml  Net -200 ml   Filed Weights   01/03/20 1820 01/04/20 2049  Weight: 123.7 kg 121.7 kg    Telemetry    SR - Personally Reviewed  ECG    No new tracings - Personally Reviewed  Physical Exam   GEN: No acute distress.   Neck: No JVD. Cardiac: RRR, no murmurs, rubs, or gallops.  Respiratory: Clear to auscultation bilaterally.  GI: Soft, nontender, non-distended.   MS: No edema; No deformity. Neuro:  Alert and oriented x 3; Nonfocal.  Psych: Normal affect.  Labs    Chemistry Recent Labs  Lab 01/03/20 1821  NA 141  K 4.2  CL 106  CO2 25  GLUCOSE 88  BUN 17  CREATININE 0.93  CALCIUM 9.1  GFRNONAA >60  GFRAA >60  ANIONGAP 10     Hematology Recent Labs  Lab 01/03/20 1821  WBC 4.9  RBC 4.26  HGB 12.6*  HCT 37.3*  MCV 87.6  MCH 29.6  MCHC 33.8  RDW 13.9  PLT 191    Cardiac EnzymesNo results for input(s): TROPONINI in the last  168 hours. No results for input(s): TROPIPOC in the last 168 hours.   BNPNo results for input(s): BNP, PROBNP in the last 168 hours.   DDimer No results for input(s): DDIMER in the last 168 hours.   Radiology    DG Chest 2 View  Result Date: 01/03/2020 IMPRESSION: No active cardiopulmonary disease. Electronically Signed   By: Ulyses Jarred M.D.   On: 01/03/2020 18:49   Cardiac Studies   LHC 03/2017:  Mid RCA lesion is 10% stenosed.  Mid Cx lesion is 25% stenosed.  Ost LAD to Prox LAD lesion is 10% stenosed.  Mid LAD lesion is 25% stenosed.  The left ventricular systolic function is normal.  LV end diastolic pressure is mildly elevated.  The left ventricular ejection fraction is 55-65% by visual estimate.  There is no aortic valve stenosis.   Nonobstructive coronary artery disease.  Continue preventive therapy.  __________  2D echo 01/04/2020: 1. Left ventricular ejection fraction, by estimation, is 60 to 65%. The  left ventricle has normal function. The left ventricle has no regional  wall motion abnormalities.  Left ventricular diastolic parameters are  indeterminate.  2. Right ventricular systolic function is normal. The right ventricular  size is normal. There is normal pulmonary artery systolic pressure. The  estimated right ventricular systolic pressure is 36.1 mmHg.  3. Left atrial size was mildly dilated. __________  [facility] MPI 01/05/2020: Pending  Patient Profile     74 y.o. male with history of nonobstructive CAD by [place] in 03/2017 as detailed below, HFpEF, DM2, HTN, HLD, and obesity with OSA on CPAP who is being seen today for the evaluation of chest pain/dyspnea at the request of Dr. Francine Graven.  Assessment & Plan    1. Chest pain with moderate retrocardiac etiology/nonobstructive CAD: -Currently chest pain-free -High-sensitivity troponin negative x3, EKG nonacute and unchanged from baseline -Echo with preserved LVSF and normal wall motion -Most  recent nonobstructive cath, nonischemic EKG, and normal troponins ACS is unlikely -Lexiscan MPI this morning, if this is normal, no further inpatient cardiac workup is indicated   2.  HFpEF: -He appears euvolemic and well compensated  3.  Morbid obesity with physical deconditioning: -Suspect this is contributing to his overall presentation -Weight loss is advised  4.  HTN: -Blood pressure reasonably controlled -Continue current therapy  For questions or updates, please contact [place] Please consult [email or web address] for contact info under Cardiology/STEMI.    Signed, Christell Faith, PA-C [facility] Pager: [phone] 01/05/2020, 9:18 AM

## 2020-01-05 NOTE — Progress Notes (Signed)
PROGRESS NOTE    Christopher Hernandez  KCM:[id number] DOB: [birth year] DOA: 01/04/2020 PCP: [name], MD    Brief Narrative:  Christopher Hernandez is a 74 y.o. male with medical history significant for diabetes mellitus, hypertension and dyslipidemia who presents to the ER for evaluation of chest pain and  shortness of breath with exertion.  Patient states that he has had symptoms for the last 1 month but over the past 1 week he has become more noticeable and more frequent.  Chest pain is mostly over the left anterior chest wall, because with exertion and radiates to his neck.  It is associated with shortness of breath and diaphoresis.  Chest pain or shortness of breath resolve at rest    Consultants:   cardiology  Procedures:   Antimicrobials:       Subjective: Lying in bed flat has no complaints. But reports walking has doe.  Although he has not gotten up today to walk  Objective: Vitals:   01/04/20 1800 01/04/20 1815 01/04/20 1845 01/04/20 2049  BP:    135/85  Pulse: (!) 25 72 70 72  Resp: (!) 40 20 17 18   Temp:    98.8 F (37.1 C)  TempSrc:    Oral  SpO2: (!) 86% 96% 98% 100%  Weight:    121.7 kg  Height:    5\' 9"  (1.753 m)    Intake/Output Summary (Last 24 hours) at 01/05/2020 0916 Last data filed at 01/04/2020 2105 Gross per 24 hour  Intake --  Output 200 ml  Net -200 ml   Filed Weights   01/03/20 1820 01/04/20 2049  Weight: 123.7 kg 121.7 kg    Examination:  General exam: Appears calm and comfortable  Respiratory system: Clear to auscultation. Respiratory effort normal. Cardiovascular system: S1 & S2 heard, RRR. No JVD, murmurs, rubs, gallops or clicks.  Gastrointestinal system: Abdomen is nondistended, soft and nontender. No organomegaly or masses felt. Normal bowel sounds heard. Central nervous system: Alert and oriented. No focal neurological deficits. Extremities: +edema  Skin: warm, dry Psychiatry: Judgement and insight appear normal. Mood & affect  appropriate.     Data Reviewed: I have personally reviewed following labs and imaging studies  CBC: Recent Labs  Lab 01/03/20 1821  WBC 4.9  HGB 12.6*  HCT 37.3*  MCV 87.6  PLT 056   Basic Metabolic Panel: Recent Labs  Lab 01/03/20 1821  NA 141  K 4.2  CL 106  CO2 25  GLUCOSE 88  BUN 17  CREATININE 0.93  CALCIUM 9.1   GFR: Estimated Creatinine Clearance: 89.8 mL/min (by C-G formula based on SCr of 0.93 mg/dL). Liver Function Tests: No results for input(s): AST, ALT, ALKPHOS, BILITOT, PROT, ALBUMIN in the last 168 hours. No results for input(s): LIPASE, AMYLASE in the last 168 hours. No results for input(s): AMMONIA in the last 168 hours. Coagulation Profile: No results for input(s): INR, PROTIME in the last 168 hours. Cardiac Enzymes: No results for input(s): CKTOTAL, CKMB, CKMBINDEX, TROPONINI in the last 168 hours. BNP (last 3 results) No results for input(s): PROBNP in the last 8760 hours. HbA1C: No results for input(s): HGBA1C in the last 72 hours. CBG: Recent Labs  Lab 01/04/20 2014  GLUCAP 102*   Lipid Profile: Recent Labs    01/04/20 1124  CHOL 120  HDL 21*  LDLCALC 64  TRIG 177*  CHOLHDL 5.7   Thyroid Function Tests: No results for input(s): TSH, T4TOTAL, FREET4, T3FREE, THYROIDAB in the last 72  hours. Anemia Panel: No results for input(s): VITAMINB12, FOLATE, FERRITIN, TIBC, IRON, RETICCTPCT in the last 72 hours. Sepsis Labs: No results for input(s): PROCALCITON, LATICACIDVEN in the last 168 hours.  Recent Results (from the past 240 hour(s))  Respiratory Panel by RT PCR (Flu A&B, Covid) - Nasopharyngeal Swab     Status: None   Collection Time: 01/04/20 11:24 AM   Specimen: Nasopharyngeal Swab  Result Value Ref Range Status   SARS Coronavirus 2 by RT PCR NEGATIVE NEGATIVE Final    Comment: (NOTE) SARS-CoV-2 target nucleic acids are NOT DETECTED.  The SARS-CoV-2 RNA is generally detectable in upper respiratoy specimens during the  acute phase of infection. The lowest concentration of SARS-CoV-2 viral copies this assay can detect is 131 copies/mL. A negative result does not preclude SARS-Cov-2 infection and should not be used as the sole basis for treatment or other patient management decisions. A negative result may occur with  improper specimen collection/handling, submission of specimen other than nasopharyngeal swab, presence of viral mutation(s) within the areas targeted by this assay, and inadequate number of viral copies (<131 copies/mL). A negative result must be combined with clinical observations, patient history, and epidemiological information. The expected result is Negative.  Fact Sheet for Patients:  PinkCheek.be  Fact Sheet for Healthcare Providers:  GravelBags.it  This test is no t yet approved or cleared by the [place] FDA and  has been authorized for detection and/or diagnosis of SARS-CoV-2 by FDA under an Emergency Use Authorization (EUA). This EUA will remain  in effect (meaning this test can be used) for the duration of the COVID-19 declaration under Section 564(b)(1) of the Act, 21 U.S.C. section 360bbb-3(b)(1), unless the authorization is terminated or revoked sooner.     Influenza A by PCR NEGATIVE NEGATIVE Final   Influenza B by PCR NEGATIVE NEGATIVE Final    Comment: (NOTE) The Xpert Xpress SARS-CoV-2/FLU/RSV assay is intended as an aid in  the diagnosis of influenza from Nasopharyngeal swab specimens and  should not be used as a sole basis for treatment. Nasal washings and  aspirates are unacceptable for Xpert Xpress SARS-CoV-2/FLU/RSV  testing.  Fact Sheet for Patients: PinkCheek.be  Fact Sheet for Healthcare Providers: GravelBags.it  This test is not yet approved or cleared by the [place] FDA and  has been authorized for detection and/or diagnosis  of SARS-CoV-2 by  FDA under an Emergency Use Authorization (EUA). This EUA will remain  in effect (meaning this test can be used) for the duration of the  Covid-19 declaration under Section 564(b)(1) of the Act, 21  U.S.C. section 360bbb-3(b)(1), unless the authorization is  terminated or revoked. Performed at [facility], [address] [place], [place] [address]          Radiology Studies: DG Chest 2 View  Result Date: 01/03/2020 CLINICAL DATA:  Chest pain EXAM: CHEST - 2 VIEW COMPARISON:  None. FINDINGS: The heart size and mediastinal contours are within normal limits. Both lungs are clear. The visualized skeletal structures are unremarkable. IMPRESSION: No active cardiopulmonary disease. Electronically Signed   By: Ulyses Jarred M.D.   On: 01/03/2020 18:49   ECHOCARDIOGRAM COMPLETE  Result Date: 01/04/2020    ECHOCARDIOGRAM REPORT   Patient Name:   Christopher Hernandez Date of Exam: 01/04/2020 Medical Rec #:  [id number]       Height:       69.0 in Accession #:    [id number]      Weight:  178.0 lb Date of Birth:  [birth year]        BSA:          1.966 m Patient Age:    70 years        BP:           135/77 mmHg Patient Gender: M               HR:           68 bpm. Exam Location:  [facility] Procedure: 2D Echo, Cardiac Doppler and Color Doppler Indications:     Chest pain 786.50  History:         Patient has no prior history of Echocardiogram examinations.                  Signs/Symptoms:Chest Pain; Risk Factors:Hypertension,                  Dyslipidemia and Diabetes.  Sonographer:     Sherrie Sport RDCS (AE) Referring Phys:  [id number] Collier Bullock Diagnosing Phys: Ida Rogue MD IMPRESSIONS  1. Left ventricular ejection fraction, by estimation, is 60 to 65%. The left ventricle has normal function. The left ventricle has no regional wall motion abnormalities. Left ventricular diastolic parameters are indeterminate.  2. Right ventricular systolic function is normal. The right ventricular  size is normal. There is normal pulmonary artery systolic pressure. The estimated right ventricular systolic pressure is 62.2 mmHg.  3. Left atrial size was mildly dilated. FINDINGS  Left Ventricle: Left ventricular ejection fraction, by estimation, is 60 to 65%. The left ventricle has normal function. The left ventricle has no regional wall motion abnormalities. The left ventricular internal cavity size was normal in size. There is  no left ventricular hypertrophy. Left ventricular diastolic parameters are indeterminate. Right Ventricle: The right ventricular size is normal. No increase in right ventricular wall thickness. Right ventricular systolic function is normal. There is normal pulmonary artery systolic pressure. The tricuspid regurgitant velocity is 2.65 m/s, and  with an assumed right atrial pressure of 5 mmHg, the estimated right ventricular systolic pressure is 29.7 mmHg. Left Atrium: Left atrial size was mildly dilated. Right Atrium: Right atrial size was normal in size. Pericardium: There is no evidence of pericardial effusion. Mitral Valve: The mitral valve is normal in structure. Trivial mitral valve regurgitation. No evidence of mitral valve stenosis. Tricuspid Valve: The tricuspid valve is normal in structure. Tricuspid valve regurgitation is not demonstrated. No evidence of tricuspid stenosis. Aortic Valve: The aortic valve is normal in structure. Aortic valve regurgitation is not visualized. No aortic stenosis is present. Aortic valve mean gradient measures 3.0 mmHg. Aortic valve peak gradient measures 5.5 mmHg. Aortic valve area, by VTI measures 2.55 cm. Pulmonic Valve: The pulmonic valve was normal in structure. Pulmonic valve regurgitation is not visualized. No evidence of pulmonic stenosis. Aorta: The aortic root is normal in size and structure. Venous: The inferior vena cava is normal in size with greater than 50% respiratory variability, suggesting right atrial pressure of 3 mmHg.  IAS/Shunts: No atrial level shunt detected by color flow Doppler.  LEFT VENTRICLE PLAX 2D LVIDd:         4.82 cm  Diastology LVIDs:         3.16 cm  LV e' medial:    8.38 cm/s LV PW:         1.68 cm  LV E/e' medial:  12.1 LV IVS:        1.51 cm  LV e' lateral:   11.50 cm/s LVOT diam:     2.20 cm  LV E/e' lateral: 8.8 LV SV:         66 LV SV Index:   34 LVOT Area:     3.80 cm  RIGHT VENTRICLE RV S prime:     12.90 cm/s TAPSE (M-mode): 4.9 cm LEFT ATRIUM            Index       RIGHT ATRIUM           Index LA diam:      4.90 cm  2.49 cm/m  RA Area:     24.30 cm LA Vol (A2C): 86.8 ml  44.15 ml/m RA Volume:   73.40 ml  37.33 ml/m LA Vol (A4C): 110.0 ml 55.95 ml/m  AORTIC VALVE                   PULMONIC VALVE AV Area (Vmax):    2.70 cm    PV Vmax:        0.82 m/s AV Area (Vmean):   2.69 cm    PV Peak grad:   2.7 mmHg AV Area (VTI):     2.55 cm    RVOT Peak grad: 5 mmHg AV Vmax:           117.50 cm/s AV Vmean:          81.550 cm/s AV VTI:            0.260 m AV Peak Grad:      5.5 mmHg AV Mean Grad:      3.0 mmHg LVOT Vmax:         83.50 cm/s LVOT Vmean:        57.700 cm/s LVOT VTI:          0.174 m LVOT/AV VTI ratio: 0.67  AORTA Ao Root diam: 3.50 cm MITRAL VALVE                TRICUSPID VALVE MV Area (PHT): 3.08 cm     TR Peak grad:   28.1 mmHg MV Decel Time: 246 msec     TR Vmax:        265.00 cm/s MV E velocity: 101.00 cm/s MV A velocity: 69.80 cm/s   SHUNTS MV E/A ratio:  1.45         Systemic VTI:  0.17 m                             Systemic Diam: 2.20 cm Ida Rogue MD Electronically signed by Ida Rogue MD Signature Date/Time: 01/04/2020/3:00:21 PM    Final         Scheduled Meds: . aspirin EC  81 mg Oral Daily  . enoxaparin (LOVENOX) injection  40 mg Subcutaneous Q24H  . fluticasone  1 spray Each Nare Daily  . lisinopril  20 mg Oral QHS  . sertraline  100 mg Oral QHS  . simvastatin  40 mg Oral QPM   Continuous Infusions:  Assessment & Plan:   Principal Problem:   Angina of  effort [facility]) Active Problems:   Type 2 diabetes mellitus with complication, without long-term current use of insulin (HCC)   OSA on CPAP   Hypertension   Depression   Effort angina-ruled out.  Per cardiology chest tightness was atypical in nature, cardiac enzymes negative, EKG unchanged Prior cardiac catheterization two thousand eighteen with nonobstructive disease Plan for  Myoview per cardiology We will continue with aspirin and statin Echo with normal EF  DOE- has not been taking his lasix at home. Mostly sedentary at baseline. Will obtain bnp Was given Lasix yesterday Has not gotten up today to see if he still short of breath with ambulation Monitor labs since given Lasix Will also need to evaluate O2 sat with ambulation   Type 2 diabetes mellitus 10/2- bg levels stable Will place pt on RISS, hypoglycemic protocal and FS cks     Depression Continue with sertraline   Hypertension Controlled  continue with lisinopril   DVT prophylaxis: Lovenox Code Status: Full Family Communication: Wife at bedside  Status is: Observation  The patient remains OBS appropriate and will d/c before 2 midnights.  Dispo: The patient is from: Home              Anticipated d/c is to: Home              Anticipated d/c date is: 1 day              Patient currently is not medically stable to d/c.            LOS: 0 days   Time spent: 45 min with >50% on coc    Nolberto Hanlon, MD [facility] Pager [phone]  If 7PM-7AM, please contact night-coverage [email or web address] Password [email or web address] 01/05/2020, 9:16 AM

## 2020-01-05 NOTE — Plan of Care (Signed)
  Problem: Education: Goal: Knowledge of [place] [facility] Education information/materials will improve Outcome: Progressing Goal: Emotional status will improve Outcome: Progressing Goal: Mental status will improve Outcome: Progressing Goal: Verbalization of understanding the information provided will improve Outcome: Progressing   Problem: Activity: Goal: Interest or engagement in activities will improve Outcome: Progressing Goal: Sleeping patterns will improve Outcome: Progressing   Problem: Coping: Goal: Ability to verbalize frustrations and anger appropriately will improve Outcome: Progressing Goal: Ability to demonstrate self-control will improve Outcome: Progressing   Problem: Health Behavior/Discharge Planning: Goal: Identification of resources available to assist in meeting health care needs will improve Outcome: Progressing Goal: Compliance with treatment plan for underlying cause of condition will improve Outcome: Progressing   Problem: Physical Regulation: Goal: Ability to maintain clinical measurements within normal limits will improve Outcome: Progressing   Problem: Safety: Goal: Periods of time without injury will increase Outcome: Progressing   

## 2020-01-06 DIAGNOSIS — I208 Other forms of angina pectoris: Secondary | ICD-10-CM | POA: Diagnosis not present

## 2020-01-06 LAB — CBC
HCT: 38.4 % — ABNORMAL LOW (ref 39.0–52.0)
Hemoglobin: 12.5 g/dL — ABNORMAL LOW (ref 13.0–17.0)
MCH: 29.1 pg (ref 26.0–34.0)
MCHC: 32.6 g/dL (ref 30.0–36.0)
MCV: 89.3 fL (ref 80.0–100.0)
Platelets: 194 10*3/uL (ref 150–400)
RBC: 4.3 MIL/uL (ref 4.22–5.81)
RDW: 14 % (ref 11.5–15.5)
WBC: 4.9 10*3/uL (ref 4.0–10.5)
nRBC: 0 % (ref 0.0–0.2)

## 2020-01-06 LAB — BASIC METABOLIC PANEL
Anion gap: 9 (ref 5–15)
BUN: 19 mg/dL (ref 8–23)
CO2: 27 mmol/L (ref 22–32)
Calcium: 8.9 mg/dL (ref 8.9–10.3)
Chloride: 104 mmol/L (ref 98–111)
Creatinine, Ser: 0.77 mg/dL (ref 0.61–1.24)
GFR calc Af Amer: >60 mL/min (ref >60)
GFR calc non Af Amer: >60 mL/min (ref >60)
Glucose, Bld: 122 mg/dL — ABNORMAL HIGH (ref 70–99)
Potassium: 4 mmol/L (ref 3.5–5.1)
Sodium: 140 mmol/L (ref 135–145)

## 2020-01-06 LAB — GLUCOSE, CAPILLARY
Glucose-Capillary: 101 mg/dL — ABNORMAL HIGH (ref 70–99)
Glucose-Capillary: 124 mg/dL — ABNORMAL HIGH (ref 70–99)
Glucose-Capillary: 128 mg/dL — ABNORMAL HIGH (ref 70–99)
Glucose-Capillary: 135 mg/dL — ABNORMAL HIGH (ref 70–99)

## 2020-01-06 LAB — HEMOGLOBIN A1C
Hgb A1c MFr Bld: 6.1 % — ABNORMAL HIGH (ref 4.8–5.6)
Mean Plasma Glucose: 128.37 mg/dL

## 2020-01-06 NOTE — Discharge Instructions (Signed)
Angina  Angina is very bad discomfort or pain in the chest, neck, arm, jaw, or back. The discomfort is caused by a lack of blood in the middle layer of the heart wall (myocardium). What are the causes? This condition is caused by a buildup of fat and cholesterol (plaque) in your arteries (atherosclerosis). This buildup narrows the arteries and makes it hard for blood to flow. What increases the risk? You are more likely to develop this condition if:  You have high levels of cholesterol in your blood.  You have high blood pressure (hypertension).  You have diabetes.  You have a family history of heart disease.  You are not active, or you do not exercise enough.  You feel sad (depressed).  You have been treated with high energy rays (radiation) on the left side of your chest. Other risk factors are:  Using tobacco.  Being very overweight (obese).  Eating a diet high in unhealthy fats (saturated fats).  Having stress, or being exposed to things that cause stress.  Using drugs, such as cocaine. Women have a greater risk for angina if:  They are older than 55.  They have stopped having their period (are in postmenopause). What are the signs or symptoms? Common symptoms of this condition in both men and women may include:  Chest pain, which may: ? Feel like a crushing or squeezing in the chest. ? Feel like a tightness, pressure, fullness, or heaviness in the chest. ? Last for more than a few minutes at a time. ? Stop and come back (recur) after a few minutes.  Pain in the neck, arm, jaw, or back.  Heartburn or upset stomach (indigestion) for no reason.  Being short of breath.  Feeling sick to your stomach (nauseous).  Sudden cold sweats. Women and people with diabetes may have other symptoms that are not usual, such as feeling:  Tired (fatigue).  Worried or nervous (anxious) for no reason.  Weak for no reason.  Dizzy or passing out (fainting). How is this  treated? This condition may be treated with:  Medicines. These are given to: ? Prevent blood clots. ? Prevent heart attack. ? Relax blood vessels and improve blood flow to the heart (nitrates). ? Reduce blood pressure. ? Improve the pumping action of the heart. ? Reduce fat and cholesterol in the blood.  A procedure to widen a narrowed or blocked artery in the heart (angioplasty).  Surgery to allow blood to go around a blocked artery (coronary artery bypass surgery). Follow these instructions at home: Medicines  Take over-the-counter and prescription medicines only as told by your doctor.  Do not take these medicines unless your doctor says that you can: ? NSAIDs. These include:  Ibuprofen.  Naproxen. ? Vitamin supplements that have vitamin A, vitamin E, or both. ? Hormone therapy that contains estrogen with or without progestin. Eating and drinking   Eat a heart-healthy diet that includes: ? Lots of fresh fruits and vegetables. ? Whole grains. ? Low-fat (lean) protein. ? Low-fat dairy products.  Follow instructions from your doctor about what you cannot eat or drink. Activity  Follow an exercise program that your doctor tells you.  Talk with your doctor about joining a program to help improve the health of your heart (cardiac rehab).  When you feel tired, take a break. Plan breaks if you know you are going to feel tired. Lifestyle   Do not use any products that contain nicotine or tobacco. This includes cigarettes, e-cigarettes, and   chewing tobacco. If you need help quitting, ask your doctor.  If your doctor says you can drink alcohol: ? Limit how much you use to:  0-1 drink a day for women who are not pregnant.  0-2 drinks a day for men. ? Be aware of how much alcohol is in your drink. In the [place] one drink equals:  One 12 oz bottle of beer (355 mL).  One 5 oz glass of wine (148 mL).  One 1 oz glass of hard liquor (44 mL). General instructions  Stay  at a healthy weight. If your doctor tells you to do so, work with him or her to lose weight.  Learn to deal with stress. If you need help, ask your doctor.  Keep your vaccines up to date. Get a flu shot every year.  Talk with your doctor if you feel sad. Take a screening test to see if you are at risk for depression.  Work with your doctor to manage any other health problems that you have. These may include diabetes or high blood pressure.  Keep all follow-up visits as told by your doctor. This is important. Get help right away if:  You have pain in your chest, neck, arm, jaw, or back, and the pain: ? Lasts more than a few minutes. ? Comes back. ? Does not get better after you take medicine under your tongue (sublingual nitroglycerin). ? Keeps getting worse. ? Comes more often.  You have any of these problems for no reason: ? Sweating a lot. ? Heartburn or upset stomach. ? Shortness of breath. ? Trouble breathing. ? Feeling sick to your stomach. ? Throwing up (vomiting). ? Feeling more tired than normal. ? Feeling nervous or worrying more than normal. ? Weakness.  You are suddenly dizzy or light-headed.  You pass out. These symptoms may be an emergency. Do not wait to see if the symptoms will go away. Get medical help right away. Call your local emergency services (911 in the [place] Do not drive yourself to the hospital. Summary  Angina is very bad discomfort or pain in the chest, neck, arm, neck, or back.  Symptoms include chest pain, heartburn or upset stomach for no reason, and shortness of breath.  Women or people with diabetes may have symptoms that are not usual, such as feeling nervous or worried for no reason, weak for no reason, or tired.  Take all medicines only as told by your doctor.  You should eat a heart-healthy diet and follow an exercise program. This information is not intended to replace advice given to you by your health care provider. Make sure you  discuss any questions you have with your health care provider. Document Revised: 11/07/2017 Document Reviewed: 11/07/2017 Elsevier Patient Education  2020 Elsevier Inc.  

## 2020-01-06 NOTE — Plan of Care (Signed)
Pt for d/c home with spouse. Condition stable at time if d/c. Verbalize understanding of instructions given.

## 2020-01-06 NOTE — Discharge Summary (Signed)
Christopher Hernandez JIR:[id number] DOB: [birth year] DOA: 01/04/2020  PCP: [name], MD  Admit date: 01/04/2020 Discharge date: 01/06/2020  Admitted From: home Disposition:  home  Recommendations for Outpatient Follow-up:  1. Follow up with PCP in 1 week 2. Please obtain BMP/CBC in one week 3.      Discharge Condition:Stable CODE STATUS:full  Diet recommendation: Heart Healthy Brief/Interim Summary: Christopher Hernandez is a 74 y.o. male with medical history significant for diabetes mellitus, hypertension and dyslipidemia who presents to the ER for evaluation of chest pain and  shortness of breath with exertion. He was admitted for cardiac monitoring and workup. Cardiology was consulted.   Effort angina-ruled out.  Per cardiology chest tightness was atypical in nature, cardiac enzymes negative, EKG unchanged Prior cardiac catheterization two thousand eighteen with nonobstructive disease  Nuclear stress test The study is normal.  This is a low risk study.       The left ventricular ejection fraction is normal (55-65%). Continue home medication If nuclear stress is normal no further inpatient cardiac work-up is indicated per cardiology Echo with normal EF F/u with Dr. Irish Lack   DOE-  BNP normal. O2 sat more than 95-96% on room air Will need to f/u with pcp to evaluate noncardiac causes of sob.    Type 2 diabetes mellitus Continue home meds on discharge    Depression Continue with sertraline   Hypertension Controlled  Continue home meds   Discharge Diagnoses:  Principal Problem:   Angina of effort [facility]) Active Problems:   Type 2 diabetes mellitus with complication, without long-term current use of insulin (HCC)   OSA on CPAP   Hypertension   Depression    Discharge Instructions  Discharge Instructions    Call MD for:  difficulty breathing, headache or visual disturbances   Complete by: As directed    Diet - low sodium heart healthy   Complete by:  As directed    Increase activity slowly   Complete by: As directed      Allergies as of 01/06/2020   No Known Allergies     Medication List    STOP taking these medications   potassium chloride SA 20 MEQ tablet Commonly known as: KLOR-CON     TAKE these medications   aspirin 81 MG tablet Take 81 mg by mouth daily.   fluticasone 50 MCG/ACT nasal spray Commonly known as: FLONASE Place into both nostrils daily.   glimepiride 2 MG tablet Commonly known as: AMARYL Take 2 mg by mouth every morning.   lisinopril 20 MG tablet Commonly known as: ZESTRIL Take 20 mg by mouth at bedtime.   metFORMIN 1000 MG tablet Commonly known as: GLUCOPHAGE Take 1,000 mg by mouth every other day.   pioglitazone 30 MG tablet Commonly known as: ACTOS Take 30 mg by mouth daily.   sertraline 100 MG tablet Commonly known as: ZOLOFT Take 100 mg by mouth at bedtime.   simvastatin 40 MG tablet Commonly known as: ZOCOR Take 40 mg by mouth every evening.       Follow-up Information    [name], MD Follow up in 1 week(s).   Specialty: Family Medicine Contact information: [address] [place] [address] [phone]        Jettie Booze, MD Follow up in 1 week(s).   Specialties: Cardiology, Radiology, Interventional Cardiology Contact information: [address] [place] [address] [phone]  No Known Allergies  Consultations:  cardiology   Procedures/Studies: DG Chest 2 View  Result Date: 01/03/2020 CLINICAL DATA:  Chest pain EXAM: CHEST - 2 VIEW COMPARISON:  None. FINDINGS: The heart size and mediastinal contours are within normal limits. Both lungs are clear. The visualized skeletal structures are unremarkable. IMPRESSION: No active cardiopulmonary disease. Electronically Signed   By: Ulyses Jarred M.D.   On: 01/03/2020 18:49   NM Myocar Multi W/Spect W/Wall Motion / EF  Result Date: 01/05/2020  No  T wave inversion was noted during stress.  There was no ST segment deviation noted during stress.  The study is normal.  This is a low risk study.  The left ventricular ejection fraction is normal (55-65%).  No prior study for comparison  Normal perfusion, with mild apical thinning at rest that improves with stress images. EF calculated at 50% but visually appears normal. Low risk stress test, no ischemia or high risk findings noted.   ECHOCARDIOGRAM COMPLETE  Result Date: 01/04/2020    ECHOCARDIOGRAM REPORT   Patient Name:   Christopher Hernandez Date of Exam: 01/04/2020 Medical Rec #:  [id number]       Height:       69.0 in Accession #:    [id number]      Weight:       178.0 lb Date of Birth:  Oct 20, 1945        BSA:          1.966 m Patient Age:    50 years        BP:           135/77 mmHg Patient Gender: M               HR:           68 bpm. Exam Location:  [facility] Procedure: 2D Echo, Cardiac Doppler and Color Doppler Indications:     Chest pain 786.50  History:         Patient has no prior history of Echocardiogram examinations.                  Signs/Symptoms:Chest Pain; Risk Factors:Hypertension,                  Dyslipidemia and Diabetes.  Sonographer:     Sherrie Sport RDCS (AE) Referring Phys:  [id number] Collier Bullock Diagnosing Phys: Ida Rogue MD IMPRESSIONS  1. Left ventricular ejection fraction, by estimation, is 60 to 65%. The left ventricle has normal function. The left ventricle has no regional wall motion abnormalities. Left ventricular diastolic parameters are indeterminate.  2. Right ventricular systolic function is normal. The right ventricular size is normal. There is normal pulmonary artery systolic pressure. The estimated right ventricular systolic pressure is 71.2 mmHg.  3. Left atrial size was mildly dilated. FINDINGS  Left Ventricle: Left ventricular ejection fraction, by estimation, is 60 to 65%. The left ventricle has normal function. The left ventricle has no regional wall motion  abnormalities. The left ventricular internal cavity size was normal in size. There is  no left ventricular hypertrophy. Left ventricular diastolic parameters are indeterminate. Right Ventricle: The right ventricular size is normal. No increase in right ventricular wall thickness. Right ventricular systolic function is normal. There is normal pulmonary artery systolic pressure. The tricuspid regurgitant velocity is 2.65 m/s, and  with an assumed right atrial pressure of 5 mmHg, the estimated right ventricular systolic pressure is 45.8 mmHg. Left Atrium: Left atrial size was mildly dilated.  Right Atrium: Right atrial size was normal in size. Pericardium: There is no evidence of pericardial effusion. Mitral Valve: The mitral valve is normal in structure. Trivial mitral valve regurgitation. No evidence of mitral valve stenosis. Tricuspid Valve: The tricuspid valve is normal in structure. Tricuspid valve regurgitation is not demonstrated. No evidence of tricuspid stenosis. Aortic Valve: The aortic valve is normal in structure. Aortic valve regurgitation is not visualized. No aortic stenosis is present. Aortic valve mean gradient measures 3.0 mmHg. Aortic valve peak gradient measures 5.5 mmHg. Aortic valve area, by VTI measures 2.55 cm. Pulmonic Valve: The pulmonic valve was normal in structure. Pulmonic valve regurgitation is not visualized. No evidence of pulmonic stenosis. Aorta: The aortic root is normal in size and structure. Venous: The inferior vena cava is normal in size with greater than 50% respiratory variability, suggesting right atrial pressure of 3 mmHg. IAS/Shunts: No atrial level shunt detected by color flow Doppler.  LEFT VENTRICLE PLAX 2D LVIDd:         4.82 cm  Diastology LVIDs:         3.16 cm  LV e' medial:    8.38 cm/s LV PW:         1.68 cm  LV E/e' medial:  12.1 LV IVS:        1.51 cm  LV e' lateral:   11.50 cm/s LVOT diam:     2.20 cm  LV E/e' lateral: 8.8 LV SV:         66 LV SV Index:   34 LVOT  Area:     3.80 cm  RIGHT VENTRICLE RV S prime:     12.90 cm/s TAPSE (M-mode): 4.9 cm LEFT ATRIUM            Index       RIGHT ATRIUM           Index LA diam:      4.90 cm  2.49 cm/m  RA Area:     24.30 cm LA Vol (A2C): 86.8 ml  44.15 ml/m RA Volume:   73.40 ml  37.33 ml/m LA Vol (A4C): 110.0 ml 55.95 ml/m  AORTIC VALVE                   PULMONIC VALVE AV Area (Vmax):    2.70 cm    PV Vmax:        0.82 m/s AV Area (Vmean):   2.69 cm    PV Peak grad:   2.7 mmHg AV Area (VTI):     2.55 cm    RVOT Peak grad: 5 mmHg AV Vmax:           117.50 cm/s AV Vmean:          81.550 cm/s AV VTI:            0.260 m AV Peak Grad:      5.5 mmHg AV Mean Grad:      3.0 mmHg LVOT Vmax:         83.50 cm/s LVOT Vmean:        57.700 cm/s LVOT VTI:          0.174 m LVOT/AV VTI ratio: 0.67  AORTA Ao Root diam: 3.50 cm MITRAL VALVE                TRICUSPID VALVE MV Area (PHT): 3.08 cm     TR Peak grad:   28.1 mmHg MV Decel Time: 246 msec  TR Vmax:        265.00 cm/s MV E velocity: 101.00 cm/s MV A velocity: 69.80 cm/s   SHUNTS MV E/A ratio:  1.45         Systemic VTI:  0.17 m                             Systemic Diam: 2.20 cm Ida Rogue MD Electronically signed by Ida Rogue MD Signature Date/Time: 01/04/2020/3:00:21 PM    Final       Subjective: No new complaints. Discussed with pt to f/u with pcp for noncardiac causes of sob.  Discharge Exam: Vitals:   01/06/20 0809 01/06/20 1152  BP: 119/69 110/65  Pulse: 69 73  Resp: 18 18  Temp: 98.5 F (36.9 C) 99.1 F (37.3 C)  SpO2: 96% 95%   Vitals:   01/05/20 2052 01/06/20 0532 01/06/20 0809 01/06/20 1152  BP: (!) 114/59 (!) 118/55 119/69 110/65  Pulse: 75 68 69 73  Resp: 19 18 18 18   Temp: (!) 97.5 F (36.4 C) 98.9 F (37.2 C) 98.5 F (36.9 C) 99.1 F (37.3 C)  TempSrc: Oral Oral Oral Oral  SpO2: 95% 93% 96% 95%  Weight:      Height:        General: Pt is alert, awake, not in acute distress Cardiovascular: RRR, S1/S2 +, no rubs, no  gallops Respiratory: CTA bilaterally, no wheezing, no rhonchi Abdominal: Soft, NT, ND, bowel sounds + Extremities: no edema, no cyanosis    The results of significant diagnostics from this hospitalization (including imaging, microbiology, ancillary and laboratory) are listed below for reference.     Microbiology: Recent Results (from the past 240 hour(s))  Respiratory Panel by RT PCR (Flu A&B, Covid) - Nasopharyngeal Swab     Status: None   Collection Time: 01/04/20 11:24 AM   Specimen: Nasopharyngeal Swab  Result Value Ref Range Status   SARS Coronavirus 2 by RT PCR NEGATIVE NEGATIVE Final    Comment: (NOTE) SARS-CoV-2 target nucleic acids are NOT DETECTED.  The SARS-CoV-2 RNA is generally detectable in upper respiratoy specimens during the acute phase of infection. The lowest concentration of SARS-CoV-2 viral copies this assay can detect is 131 copies/mL. A negative result does not preclude SARS-Cov-2 infection and should not be used as the sole basis for treatment or other patient management decisions. A negative result may occur with  improper specimen collection/handling, submission of specimen other than nasopharyngeal swab, presence of viral mutation(s) within the areas targeted by this assay, and inadequate number of viral copies (<131 copies/mL). A negative result must be combined with clinical observations, patient history, and epidemiological information. The expected result is Negative.  Fact Sheet for Patients:  PinkCheek.be  Fact Sheet for Healthcare Providers:  GravelBags.it  This test is no t yet approved or cleared by the [place] FDA and  has been authorized for detection and/or diagnosis of SARS-CoV-2 by FDA under an Emergency Use Authorization (EUA). This EUA will remain  in effect (meaning this test can be used) for the duration of the COVID-19 declaration under Section 564(b)(1) of the Act,  21 U.S.C. section 360bbb-3(b)(1), unless the authorization is terminated or revoked sooner.     Influenza A by PCR NEGATIVE NEGATIVE Final   Influenza B by PCR NEGATIVE NEGATIVE Final    Comment: (NOTE) The Xpert Xpress SARS-CoV-2/FLU/RSV assay is intended as an aid in  the diagnosis of influenza from Nasopharyngeal swab  specimens and  should not be used as a sole basis for treatment. Nasal washings and  aspirates are unacceptable for Xpert Xpress SARS-CoV-2/FLU/RSV  testing.  Fact Sheet for Patients: PinkCheek.be  Fact Sheet for Healthcare Providers: GravelBags.it  This test is not yet approved or cleared by the [place] FDA and  has been authorized for detection and/or diagnosis of SARS-CoV-2 by  FDA under an Emergency Use Authorization (EUA). This EUA will remain  in effect (meaning this test can be used) for the duration of the  Covid-19 declaration under Section 564(b)(1) of the Act, 21  U.S.C. section 360bbb-3(b)(1), unless the authorization is  terminated or revoked. Performed at [facility], [address] [place], [place] [address]      Labs: BNP (last 3 results) Recent Labs    01/05/20 1614  BNP 35.0   Basic Metabolic Panel: Recent Labs  Lab 01/03/20 1821 01/05/20 1614 01/06/20 0512  NA 141 139 140  K 4.2 3.9 4.0  CL 106 102 104  CO2 25 28 27   GLUCOSE 88 123* 122*  BUN 17 19 19   CREATININE 0.93 1.01 0.77  CALCIUM 9.1 8.9 8.9   Liver Function Tests: No results for input(s): AST, ALT, ALKPHOS, BILITOT, PROT, ALBUMIN in the last 168 hours. No results for input(s): LIPASE, AMYLASE in the last 168 hours. No results for input(s): AMMONIA in the last 168 hours. CBC: Recent Labs  Lab 01/03/20 1821 01/06/20 0512  WBC 4.9 4.9  HGB 12.6* 12.5*  HCT 37.3* 38.4*  MCV 87.6 89.3  PLT 191 194   Cardiac Enzymes: No results for input(s): CKTOTAL, CKMB, CKMBINDEX, TROPONINI in the  last 168 hours. BNP: Invalid input(s): POCBNP CBG: Recent Labs  Lab 01/05/20 2153 01/06/20 0008 01/06/20 0432 01/06/20 0811 01/06/20 1153  GLUCAP 105* 101* 124* 135* 128*   D-Dimer No results for input(s): DDIMER in the last 72 hours. Hgb A1c Recent Labs    01/05/20 1614  HGBA1C 6.1*   Lipid Profile Recent Labs    01/04/20 1124  CHOL 120  HDL 21*  LDLCALC 64  TRIG 177*  CHOLHDL 5.7   Thyroid function studies No results for input(s): TSH, T4TOTAL, T3FREE, THYROIDAB in the last 72 hours.  Invalid input(s): FREET3 Anemia work up No results for input(s): VITAMINB12, FOLATE, FERRITIN, TIBC, IRON, RETICCTPCT in the last 72 hours. Urinalysis No results found for: COLORURINE, APPEARANCEUR, [place], [place], [place], [place], [place], [place], PROTEINUR, UROBILINOGEN, NITRITE, LEUKOCYTESUR Sepsis Labs Invalid input(s): PROCALCITONIN,  WBC,  LACTICIDVEN Microbiology Recent Results (from the past 240 hour(s))  Respiratory Panel by RT PCR (Flu A&B, Covid) - Nasopharyngeal Swab     Status: None   Collection Time: 01/04/20 11:24 AM   Specimen: Nasopharyngeal Swab  Result Value Ref Range Status   SARS Coronavirus 2 by RT PCR NEGATIVE NEGATIVE Final    Comment: (NOTE) SARS-CoV-2 target nucleic acids are NOT DETECTED.  The SARS-CoV-2 RNA is generally detectable in upper respiratoy specimens during the acute phase of infection. The lowest concentration of SARS-CoV-2 viral copies this assay can detect is 131 copies/mL. A negative result does not preclude SARS-Cov-2 infection and should not be used as the sole basis for treatment or other patient management decisions. A negative result may occur with  improper specimen collection/handling, submission of specimen other than nasopharyngeal swab, presence of viral mutation(s) within the areas targeted by this assay, and inadequate number of viral copies (<131 copies/mL). A negative result must be combined with  clinical observations, patient  history, and epidemiological information. The expected result is Negative.  Fact Sheet for Patients:  PinkCheek.be  Fact Sheet for Healthcare Providers:  GravelBags.it  This test is no t yet approved or cleared by the [place] FDA and  has been authorized for detection and/or diagnosis of SARS-CoV-2 by FDA under an Emergency Use Authorization (EUA). This EUA will remain  in effect (meaning this test can be used) for the duration of the COVID-19 declaration under Section 564(b)(1) of the Act, 21 U.S.C. section 360bbb-3(b)(1), unless the authorization is terminated or revoked sooner.     Influenza A by PCR NEGATIVE NEGATIVE Final   Influenza B by PCR NEGATIVE NEGATIVE Final    Comment: (NOTE) The Xpert Xpress SARS-CoV-2/FLU/RSV assay is intended as an aid in  the diagnosis of influenza from Nasopharyngeal swab specimens and  should not be used as a sole basis for treatment. Nasal washings and  aspirates are unacceptable for Xpert Xpress SARS-CoV-2/FLU/RSV  testing.  Fact Sheet for Patients: PinkCheek.be  Fact Sheet for Healthcare Providers: GravelBags.it  This test is not yet approved or cleared by the [place] FDA and  has been authorized for detection and/or diagnosis of SARS-CoV-2 by  FDA under an Emergency Use Authorization (EUA). This EUA will remain  in effect (meaning this test can be used) for the duration of the  Covid-19 declaration under Section 564(b)(1) of the Act, 21  U.S.C. section 360bbb-3(b)(1), unless the authorization is  terminated or revoked. Performed at [facility], [address] [place], [place] [address]      Time coordinating discharge: Over 30 minutes  SIGNED:   Nolberto Hanlon, MD  [facility] 01/06/2020, 3:21 PM Pager   If 7PM-7AM, please contact  night-coverage [email or web address] Password [email or web address]

## 2020-01-07 ENCOUNTER — Telehealth: Payer: Self-pay | Admitting: Interventional Cardiology

## 2020-01-07 NOTE — Telephone Encounter (Signed)
    Pt said he had chest pain last Friday and went to [facility] ED. He said he suddenly felt lightheadedness and SOB. Everything was checked in ED, he had EKG, he wanted to checked in with DR. Irish Lack if he needs a f/u with him

## 2020-01-07 NOTE — Telephone Encounter (Signed)
Called and spoke to patient. Stable work-up in ER and felt to be non-cardiac. Patient was discharged with instructions to follow up with PCP and cardiology. Patient is seeing PCP tomorrow and is scheduled to see Dr. Irish Lack on 10/29. He will let us know if his Sx change or worsen before then.

## 2020-01-08 DIAGNOSIS — D649 Anemia, unspecified: Secondary | ICD-10-CM | POA: Diagnosis not present

## 2020-01-08 DIAGNOSIS — I110 Hypertensive heart disease with heart failure: Secondary | ICD-10-CM | POA: Diagnosis not present

## 2020-01-08 DIAGNOSIS — E1169 Type 2 diabetes mellitus with other specified complication: Secondary | ICD-10-CM | POA: Diagnosis not present

## 2020-01-08 DIAGNOSIS — H6692 Otitis media, unspecified, left ear: Secondary | ICD-10-CM | POA: Diagnosis not present

## 2020-01-08 DIAGNOSIS — R0600 Dyspnea, unspecified: Secondary | ICD-10-CM | POA: Diagnosis not present

## 2020-01-08 DIAGNOSIS — I5032 Chronic diastolic (congestive) heart failure: Secondary | ICD-10-CM | POA: Diagnosis not present

## 2020-01-10 DIAGNOSIS — I110 Hypertensive heart disease with heart failure: Secondary | ICD-10-CM | POA: Diagnosis not present

## 2020-01-10 DIAGNOSIS — H6692 Otitis media, unspecified, left ear: Secondary | ICD-10-CM | POA: Diagnosis not present

## 2020-01-10 DIAGNOSIS — E1169 Type 2 diabetes mellitus with other specified complication: Secondary | ICD-10-CM | POA: Diagnosis not present

## 2020-01-10 DIAGNOSIS — R0600 Dyspnea, unspecified: Secondary | ICD-10-CM | POA: Diagnosis not present

## 2020-01-10 DIAGNOSIS — Z794 Long term (current) use of insulin: Secondary | ICD-10-CM | POA: Diagnosis not present

## 2020-01-10 DIAGNOSIS — R5381 Other malaise: Secondary | ICD-10-CM | POA: Diagnosis not present

## 2020-01-10 DIAGNOSIS — I5032 Chronic diastolic (congestive) heart failure: Secondary | ICD-10-CM | POA: Diagnosis not present

## 2020-01-14 DIAGNOSIS — G4733 Obstructive sleep apnea (adult) (pediatric): Secondary | ICD-10-CM | POA: Diagnosis not present

## 2020-01-15 NOTE — H&P (View-Only) (Signed)
Cardiology Office Note   Date:  01/16/2020   ID:  Angelina Sheriff Jasim Harari., DOB [birth year], MRN [id number]  PCP:  [name], MD    No chief complaint on file.  Chronic diastolic heart failure  Wt Readings from Last 3 Encounters:  01/16/20 272 lb 3.2 oz (123.5 kg)  01/04/20 268 lb 4.8 oz (121.7 kg)  06/01/19 272 lb 12.8 oz (123.7 kg)       History of Present Illness: Somnang Mahan. is a 74 y.o. male  who is being seen today fordiastolic HF, medication monitoring, as well as for post cath f/u.  Cardiac risk factors include HTN, HLD, DM and obesity. Also a h/o OSA, on CPAP therapy. In 2018, he complained of exertional dyspnea, fatigue and some chest tightness. He was set up for elective LHC, performed 03/24/17. He was found to have nonobstructive CAD (10-25% disease in RCA, LAD and LCx). EF was normal at 55-60%. LVEDP was mildly elevated. He was discharged home with plans to continue medical therapy of cardiac risk factors for further prevention.   On 03/31/17, he called the office with complaints of continued dyspnea, in addition to new LEE. He was told to start Lasix 40 mg in addition to KDur, 10 mEq daily.   Lisinopril was started for BP and readings were improved in Dec 2020.  He was in the hospital at [facility] and was admitted in 10/21.  Ruled out for MI.  Stress test showed:  "No T wave inversion was noted during stress.  There was no ST segment deviation noted during stress.  The study is normal.  This is a low risk study.  The left ventricular ejection fraction is normal (55-65%).  No prior study for comparison   Normal perfusion, with mild apical thinning at rest that improves with stress images. EF calculated at 50% but visually appears normal. Low risk stress test, no ischemia or high risk findings noted."  Using CPAP at night.  He continues to report that he has some DOE, chest tightness and sweating with walking.      Past Medical  History:  Diagnosis Date  . BMI 40.0-44.9, adult ([place])   . Chest pain at rest   . Coronary artery disease, non-occlusive   . Diabetes mellitus   . Hyperlipemia   . Hypertension   . OSA on CPAP   . Peptic ulcer disease   . Recurrent major depressive disorder ([place])   . Rhinitis, allergic   . Routine general medical examination at a health care facility   . Xerosis of skin     Past Surgical History:  Procedure Laterality Date  . LEFT HEART CATH AND CORONARY ANGIOGRAPHY N/A 03/24/2017   Procedure: LEFT HEART CATH AND CORONARY ANGIOGRAPHY;  Surgeon: Jettie Booze, MD;  Location: [place] [facility];  Service: Cardiovascular;  Laterality: N/A;     Current Outpatient Medications  Medication Sig Dispense Refill  . aspirin 81 MG tablet Take 81 mg by mouth daily.    . fluticasone (FLONASE) 50 MCG/ACT nasal spray Place into both nostrils daily.    Marland Kitchen glimepiride (AMARYL) 2 MG tablet Take 2 mg by mouth every morning.    Marland Kitchen lisinopril (ZESTRIL) 20 MG tablet Take 10 mg by mouth at bedtime.     . metFORMIN (GLUCOPHAGE) 1000 MG tablet Take 1,000 mg by mouth every other day.    . sertraline (ZOLOFT) 100 MG tablet Take 100 mg by mouth at bedtime.     Marland Kitchen  simvastatin (ZOCOR) 40 MG tablet Take 40 mg by mouth every evening.     No current facility-administered medications for this visit.    Allergies:   Patient has no known allergies.    Social History:  The patient  reports that he has never smoked. He has never used smokeless tobacco. He reports that he does not drink alcohol and does not use drugs.   Family History:  The patient's family history includes Cirrhosis in his father; Diabetes Mellitus I in his mother; Hypertension in his mother; Liver disease in his father.    ROS:  Please see the history of present illness.   Otherwise, review of systems are positive for DOE.   All other systems are reviewed and negative.    PHYSICAL EXAM: VS:  BP (!) 110/58   Pulse 68   Ht 5\' 9"  (1.753  m)   Wt 272 lb 3.2 oz (123.5 kg)   SpO2 92%   BMI 40.20 kg/m  , BMI Body mass index is 40.2 kg/m. GEN: Well nourished, well developed, in no acute distress  HEENT: normal  Neck: no JVD, carotid bruits, or masses Cardiac: RRR; no murmurs, rubs, or gallops,no edema  Respiratory:  clear to auscultation bilaterally, normal work of breathing GI: soft, nontender, nondistended, + BS MS: no deformity or atrophy , obese Skin: warm and dry, no rash Neuro:  Strength and sensation are intact Psych: euthymic mood, full affect   EKG:   The ekg ordered today demonstrates NSR, RBBB, no ST changes   Recent Labs: 01/05/2020: B Natriuretic Peptide 77.1 01/06/2020: BUN 19; Creatinine, Ser 0.77; Hemoglobin 12.5; Platelets 194; Potassium 4.0; Sodium 140   Lipid Panel    Component Value Date/Time   CHOL 120 01/04/2020 1124   TRIG 177 (H) 01/04/2020 1124   HDL 21 (L) 01/04/2020 1124   CHOLHDL 5.7 01/04/2020 1124   VLDL 35 01/04/2020 1124   [place] 64 01/04/2020 1124     Other studies Reviewed: Additional studies/ records that were reviewed today with results demonstrating: Hospital records noted from [facility].   ASSESSMENT AND PLAN:  1.   Chronic diastolic heart failure: Appears euvolemic.  Recent BNP was normal.  Precordial chest pain is persisting with activity.  Severe DOE.  Plan for R/L heart cath.  Cardiac catheterization was discussed with the patient fully. The patient understands that risks include but are not limited to stroke (1 in 1000), death (1 in 29), kidney failure [usually temporary] (1 in 500), bleeding (1 in 200), allergic reaction [possibly serious] (1 in 200).  The patient understands and is willing to proceed.     2.   HTN: The current medical regimen is effective;  continue present plan and medications. 3.   LE edema: elevate legs.    Current medicines are reviewed at length with the patient today.  The patient concerns regarding his medicines were addressed.  The  following changes have been made:  No change  Labs/ tests ordered today include:  No orders of the defined types were placed in this encounter.   Recommend 150 minutes/week of aerobic exercise Low fat, low carb, high fiber diet recommended  Disposition:   FU for cath   Signed, Larae Grooms, MD  01/16/2020 12:17 PM    [facility] [place], [place], [place]  [address] Phone: [phone]; Fax: [phone]

## 2020-01-15 NOTE — Progress Notes (Signed)
Cardiology Office Note   Date:  01/16/2020   ID:  Christopher Hernandez., DOB September 03, 1945, MRN [id number]  PCP:  [name], MD    No chief complaint on file.  Chronic diastolic heart failure  Wt Readings from Last 3 Encounters:  01/16/20 272 lb 3.2 oz (123.5 kg)  01/04/20 268 lb 4.8 oz (121.7 kg)  06/01/19 272 lb 12.8 oz (123.7 kg)       History of Present Illness: Christopher Hernandez. is a 74 y.o. male  who is being seen today fordiastolic HF, medication monitoring, as well as for post cath f/u.  Cardiac risk factors include HTN, HLD, DM and obesity. Also a h/o OSA, on CPAP therapy. In 2018, he complained of exertional dyspnea, fatigue and some chest tightness. He was set up for elective LHC, performed 03/24/17. He was found to have nonobstructive CAD (10-25% disease in RCA, LAD and LCx). EF was normal at 55-60%. LVEDP was mildly elevated. He was discharged home with plans to continue medical therapy of cardiac risk factors for further prevention.   On 03/31/17, he called the office with complaints of continued dyspnea, in addition to new LEE. He was told to start Lasix 40 mg in addition to KDur, 10 mEq daily.   Lisinopril was started for BP and readings were improved in Dec 2020.  He was in the hospital at [facility] and was admitted in 10/21.  Ruled out for MI.  Stress test showed:  "No T wave inversion was noted during stress.  There was no ST segment deviation noted during stress.  The study is normal.  This is a low risk study.  The left ventricular ejection fraction is normal (55-65%).  No prior study for comparison   Normal perfusion, with mild apical thinning at rest that improves with stress images. EF calculated at 50% but visually appears normal. Low risk stress test, no ischemia or high risk findings noted."  Using CPAP at night.  He continues to report that he has some DOE, chest tightness and sweating with walking.      Past Medical  History:  Diagnosis Date  . BMI 40.0-44.9, adult ([place])   . Chest pain at rest   . Coronary artery disease, non-occlusive   . Diabetes mellitus   . Hyperlipemia   . Hypertension   . OSA on CPAP   . Peptic ulcer disease   . Recurrent major depressive disorder ([place])   . Rhinitis, allergic   . Routine general medical examination at a health care facility   . Xerosis of skin     Past Surgical History:  Procedure Laterality Date  . LEFT HEART CATH AND CORONARY ANGIOGRAPHY N/A 03/24/2017   Procedure: LEFT HEART CATH AND CORONARY ANGIOGRAPHY;  Surgeon: Jettie Booze, MD;  Location: [place] [facility];  Service: Cardiovascular;  Laterality: N/A;     Current Outpatient Medications  Medication Sig Dispense Refill  . aspirin 81 MG tablet Take 81 mg by mouth daily.    . fluticasone (FLONASE) 50 MCG/ACT nasal spray Place into both nostrils daily.    Marland Kitchen glimepiride (AMARYL) 2 MG tablet Take 2 mg by mouth every morning.    Marland Kitchen lisinopril (ZESTRIL) 20 MG tablet Take 10 mg by mouth at bedtime.     . metFORMIN (GLUCOPHAGE) 1000 MG tablet Take 1,000 mg by mouth every other day.    . sertraline (ZOLOFT) 100 MG tablet Take 100 mg by mouth at bedtime.     Marland Kitchen  simvastatin (ZOCOR) 40 MG tablet Take 40 mg by mouth every evening.     No current facility-administered medications for this visit.    Allergies:   Patient has no known allergies.    Social History:  The patient  reports that he has never smoked. He has never used smokeless tobacco. He reports that he does not drink alcohol and does not use drugs.   Family History:  The patient's family history includes Cirrhosis in his father; Diabetes Mellitus I in his mother; Hypertension in his mother; Liver disease in his father.    ROS:  Please see the history of present illness.   Otherwise, review of systems are positive for DOE.   All other systems are reviewed and negative.    PHYSICAL EXAM: VS:  BP (!) 110/58   Pulse 68   Ht 5\' 9"  (1.753  m)   Wt 272 lb 3.2 oz (123.5 kg)   SpO2 92%   BMI 40.20 kg/m  , BMI Body mass index is 40.2 kg/m. GEN: Well nourished, well developed, in no acute distress  HEENT: normal  Neck: no JVD, carotid bruits, or masses Cardiac: RRR; no murmurs, rubs, or gallops,no edema  Respiratory:  clear to auscultation bilaterally, normal work of breathing GI: soft, nontender, nondistended, + BS MS: no deformity or atrophy , obese Skin: warm and dry, no rash Neuro:  Strength and sensation are intact Psych: euthymic mood, full affect   EKG:   The ekg ordered today demonstrates NSR, RBBB, no ST changes   Recent Labs: 01/05/2020: B Natriuretic Peptide 77.1 01/06/2020: BUN 19; Creatinine, Ser 0.77; Hemoglobin 12.5; Platelets 194; Potassium 4.0; Sodium 140   Lipid Panel    Component Value Date/Time   CHOL 120 01/04/2020 1124   TRIG 177 (H) 01/04/2020 1124   HDL 21 (L) 01/04/2020 1124   CHOLHDL 5.7 01/04/2020 1124   VLDL 35 01/04/2020 1124   [place] 64 01/04/2020 1124     Other studies Reviewed: Additional studies/ records that were reviewed today with results demonstrating: Hospital records noted from [facility].   ASSESSMENT AND PLAN:  1.   Chronic diastolic heart failure: Appears euvolemic.  Recent BNP was normal.  Precordial chest pain is persisting with activity.  Severe DOE.  Plan for R/L heart cath.  Cardiac catheterization was discussed with the patient fully. The patient understands that risks include but are not limited to stroke (1 in 1000), death (1 in 34), kidney failure [usually temporary] (1 in 500), bleeding (1 in 200), allergic reaction [possibly serious] (1 in 200).  The patient understands and is willing to proceed.     2.   HTN: The current medical regimen is effective;  continue present plan and medications. 3.   LE edema: elevate legs.    Current medicines are reviewed at length with the patient today.  The patient concerns regarding his medicines were addressed.  The  following changes have been made:  No change  Labs/ tests ordered today include:  No orders of the defined types were placed in this encounter.   Recommend 150 minutes/week of aerobic exercise Low fat, low carb, high fiber diet recommended  Disposition:   FU for cath   Signed, Larae Grooms, MD  01/16/2020 12:17 PM    [facility] [place], [place], [place]  [address] Phone: [phone]; Fax: [phone]

## 2020-01-16 ENCOUNTER — Other Ambulatory Visit: Payer: Self-pay

## 2020-01-16 ENCOUNTER — Ambulatory Visit: Payer: Medicare HMO | Admitting: Interventional Cardiology

## 2020-01-16 ENCOUNTER — Encounter: Payer: Self-pay | Admitting: Interventional Cardiology

## 2020-01-16 ENCOUNTER — Other Ambulatory Visit (HOSPITAL_COMMUNITY)
Admission: RE | Admit: 2020-01-16 | Discharge: 2020-01-16 | Disposition: A | Discharge status: Home / Self Care | Payer: Medicare HMO | Source: Ambulatory Visit | Attending: Interventional Cardiology | Admitting: Interventional Cardiology

## 2020-01-16 VITALS — BP 110/58 | HR 68 | Ht 69.0 in | Wt 272.2 lb

## 2020-01-16 DIAGNOSIS — Z20822 Contact with and (suspected) exposure to covid-19: Secondary | ICD-10-CM | POA: Insufficient documentation

## 2020-01-16 DIAGNOSIS — I5032 Chronic diastolic (congestive) heart failure: Secondary | ICD-10-CM | POA: Diagnosis not present

## 2020-01-16 DIAGNOSIS — R600 Localized edema: Secondary | ICD-10-CM

## 2020-01-16 DIAGNOSIS — Z01812 Encounter for preprocedural laboratory examination: Secondary | ICD-10-CM | POA: Diagnosis not present

## 2020-01-16 DIAGNOSIS — I10 Essential (primary) hypertension: Secondary | ICD-10-CM | POA: Diagnosis not present

## 2020-01-16 LAB — SARS CORONAVIRUS 2 (TAT 6-24 HRS): SARS Coronavirus 2: NEGATIVE

## 2020-01-16 NOTE — Patient Instructions (Signed)
Medication Instructions:  Your physician recommends that you continue on your current medications as directed. Please refer to the Current Medication list given to you today.  *If you need a refill on your cardiac medications before your next appointment, please call your pharmacy*   Lab Work: None  If you have labs (blood work) drawn today and your tests are completely normal, you will receive your results only by: Marland Kitchen MyChart Message (if you have MyChart) OR . A paper copy in the mail If you have any lab test that is abnormal or we need to change your treatment, we will call you to review the results.   Testing/Procedures: Your physician has requested that you have a cardiac catheterization. Cardiac catheterization is used to diagnose and/or treat various heart conditions. Doctors may recommend this procedure for a number of different reasons. The most common reason is to evaluate chest pain. Chest pain can be a symptom of coronary artery disease (CAD), and cardiac catheterization can show whether plaque is narrowing or blocking your heart's arteries. This procedure is also used to evaluate the valves, as well as measure the blood flow and oxygen levels in different parts of your heart. For further information please visit [facility]. Please follow instruction sheet, as given.  Follow-Up: Follow up with Dr. Irish Lack on 02/06/20 at 3:20 PM  Other [facility] OFFICE [place], [place] [address] Dept: [phone] Loc: [place].  01/16/2020  You are scheduled for a Cardiac Catheterization on Friday, October 15 with Dr. Larae Grooms.  1. Please arrive at the [facility] (Main Entrance A) at [facility]: [address] [place], [place] [address] at 10:00 AM (This time is two hours before your procedure to ensure your  preparation). Free valet parking service is available.   Special note: Every effort is made to have your procedure done on time. Please understand that emergencies sometimes delay scheduled procedures.  2. Diet: Do not eat solid foods after midnight.  The patient may have clear liquids until 5am upon the day of the procedure.  3. Labs: Labs done 10/3, EKG done 9/30  Due to recent COVID-19 restrictions implemented by our local and state authorities and in an effort to keep both patients and staff as safe as possible, our hospital system requires COVID-19 testing prior to certain scheduled hospital procedures.  Please go to [place]. [place], [place] [id number] on TODAY at 1:15 PM.  This is a drive up testing site.  You will not need to exit your vehicle.  You will not be billed at the time of testing but may receive a bill later depending on your insurance. You must agree to self-quarantine from the time of your testing until the procedure date on 01/18/20.  This should included staying home with ONLY the people you live with.  Avoid take-out, grocery store shopping or leaving the house for any non-emergent reason.  Failure to have your COVID-19 test done on the date and time you have been scheduled will result in cancellation of your procedure.  Please call our office at [phone] if you have any questions.   4. Medication instructions in preparation for your procedure:   Contrast Allergy: No  DO NOT take glimiperide the morning of the procedure  DO NOT take metformin the morning of the procedure and for 48 hours after the procedure  On  the morning of your procedure, take your Aspirin and any morning medicines NOT listed above.  You may use sips of water.  5. Plan for one night stay--bring personal belongings. 6. Bring a current list of your medications and current insurance cards. 7. You MUST have a responsible person to drive you home. 8. Someone MUST be with you the first 24 hours  after you arrive home or your discharge will be delayed. 9. Please wear clothes that are easy to get on and off and wear slip-on shoes.  Thank you for allowing [place] to care for you!   -- [facility]

## 2020-01-17 ENCOUNTER — Telehealth: Payer: Self-pay | Admitting: *Deleted

## 2020-01-17 NOTE — Telephone Encounter (Addendum)
Pt contacted pre-catheterization scheduled at [facility] for: Friday January 18, 2020 12 Noon  Verified arrival time and place: [facility]) at: 10 AM   No solid food after midnight prior to cath, clear liquids until 5 AM day of procedure.  Hold: Glimepiride-AM of procedure Metformin-day of procedure and 48 hours post procedure  Except hold medications AM meds can be  taken pre-cath with sips of water including: ASA 81 mg   Confirmed patient has responsible adult to drive home post procedure and be with patient first 24 hours after arriving home: yes  You are allowed ONE visitor in the waiting room during the time you are at the hospital for your procedure. Both you and your visitor must wear a mask once you enter the hospital.       COVID-19 Pre-Screening Questions:  . In the past 14 days have you had a new cough, new headache, new nasal congestion, fever (100.4 or greater) unexplained body aches, new sore throat, or sudden loss of taste or sense of smell? no . In the past 14 days have you been around anyone with known Covid 19? no . Have you been vaccinated for COVID-19? Yes, see immunization history.  Reviewed procedure/mask/visitor instructions, COVID-19 questions with patient.

## 2020-01-18 ENCOUNTER — Other Ambulatory Visit: Payer: Self-pay

## 2020-01-18 ENCOUNTER — Ambulatory Visit (HOSPITAL_COMMUNITY)
Admission: RE | Admit: 2020-01-18 | Discharge: 2020-01-18 | Disposition: A | Discharge status: Home / Self Care | Payer: Medicare HMO | Attending: Interventional Cardiology | Admitting: Interventional Cardiology

## 2020-01-18 ENCOUNTER — Encounter (HOSPITAL_COMMUNITY)
Admission: RE | Disposition: A | Discharge status: Home / Self Care | Payer: Self-pay | Source: Home / Self Care | Attending: Interventional Cardiology

## 2020-01-18 DIAGNOSIS — I110 Hypertensive heart disease with heart failure: Secondary | ICD-10-CM | POA: Insufficient documentation

## 2020-01-18 DIAGNOSIS — G4733 Obstructive sleep apnea (adult) (pediatric): Secondary | ICD-10-CM | POA: Diagnosis not present

## 2020-01-18 DIAGNOSIS — Z7982 Long term (current) use of aspirin: Secondary | ICD-10-CM | POA: Diagnosis not present

## 2020-01-18 DIAGNOSIS — R0609 Other forms of dyspnea: Secondary | ICD-10-CM | POA: Diagnosis not present

## 2020-01-18 DIAGNOSIS — Z6841 Body Mass Index (BMI) 40.0 and over, adult: Secondary | ICD-10-CM | POA: Insufficient documentation

## 2020-01-18 DIAGNOSIS — R0600 Dyspnea, unspecified: Secondary | ICD-10-CM

## 2020-01-18 DIAGNOSIS — E785 Hyperlipidemia, unspecified: Secondary | ICD-10-CM | POA: Diagnosis not present

## 2020-01-18 DIAGNOSIS — R69 Illness, unspecified: Secondary | ICD-10-CM | POA: Diagnosis not present

## 2020-01-18 DIAGNOSIS — Z79899 Other long term (current) drug therapy: Secondary | ICD-10-CM | POA: Insufficient documentation

## 2020-01-18 DIAGNOSIS — Z7984 Long term (current) use of oral hypoglycemic drugs: Secondary | ICD-10-CM | POA: Insufficient documentation

## 2020-01-18 DIAGNOSIS — I5032 Chronic diastolic (congestive) heart failure: Secondary | ICD-10-CM | POA: Insufficient documentation

## 2020-01-18 DIAGNOSIS — I2510 Atherosclerotic heart disease of native coronary artery without angina pectoris: Secondary | ICD-10-CM | POA: Diagnosis not present

## 2020-01-18 DIAGNOSIS — F339 Major depressive disorder, recurrent, unspecified: Secondary | ICD-10-CM | POA: Insufficient documentation

## 2020-01-18 DIAGNOSIS — E669 Obesity, unspecified: Secondary | ICD-10-CM | POA: Insufficient documentation

## 2020-01-18 DIAGNOSIS — E119 Type 2 diabetes mellitus without complications: Secondary | ICD-10-CM | POA: Insufficient documentation

## 2020-01-18 DIAGNOSIS — R072 Precordial pain: Secondary | ICD-10-CM | POA: Diagnosis not present

## 2020-01-18 HISTORY — PX: RIGHT/LEFT HEART CATH AND CORONARY ANGIOGRAPHY: CATH118266

## 2020-01-18 LAB — POCT I-STAT EG7
Acid-Base Excess: 1 mmol/L (ref 0.0–2.0)
Bicarbonate: 26.6 mmol/L (ref 20.0–28.0)
Calcium, Ion: 1.25 mmol/L (ref 1.15–1.40)
HCT: 36 % — ABNORMAL LOW (ref 39.0–52.0)
Hemoglobin: 12.2 g/dL — ABNORMAL LOW (ref 13.0–17.0)
O2 Saturation: 68 %
Potassium: 4 mmol/L (ref 3.5–5.1)
Sodium: 144 mmol/L (ref 135–145)
TCO2: 28 mmol/L (ref 22–32)
pCO2, Ven: 46.6 mmHg (ref 44.0–60.0)
pH, Ven: 7.363 (ref 7.250–7.430)
pO2, Ven: 37 mmHg (ref 32.0–45.0)

## 2020-01-18 LAB — POCT I-STAT 7, (LYTES, BLD GAS, ICA,H+H)
Acid-Base Excess: 0 mmol/L (ref 0.0–2.0)
Bicarbonate: 25.4 mmol/L (ref 20.0–28.0)
Calcium, Ion: 1.24 mmol/L (ref 1.15–1.40)
HCT: 36 % — ABNORMAL LOW (ref 39.0–52.0)
Hemoglobin: 12.2 g/dL — ABNORMAL LOW (ref 13.0–17.0)
O2 Saturation: 92 %
Potassium: 4 mmol/L (ref 3.5–5.1)
Sodium: 144 mmol/L (ref 135–145)
TCO2: 27 mmol/L (ref 22–32)
pCO2 arterial: 41.3 mmHg (ref 32.0–48.0)
pH, Arterial: 7.396 (ref 7.350–7.450)
pO2, Arterial: 63 mmHg — ABNORMAL LOW (ref 83.0–108.0)

## 2020-01-18 LAB — GLUCOSE, CAPILLARY: Glucose-Capillary: 103 mg/dL — ABNORMAL HIGH (ref 70–99)

## 2020-01-18 SURGERY — RIGHT/LEFT HEART CATH AND CORONARY ANGIOGRAPHY
Anesthesia: LOCAL

## 2020-01-18 MED ORDER — MIDAZOLAM HCL 2 MG/2ML IJ SOLN
INTRAMUSCULAR | Status: DC | PRN
  Administered 2020-01-18 (×2): 1 mg via INTRAVENOUS

## 2020-01-18 MED ORDER — MIDAZOLAM HCL 2 MG/2ML IJ SOLN
INTRAMUSCULAR | Status: AC
  Filled 2020-01-18: qty 2

## 2020-01-18 MED ORDER — LIDOCAINE HCL (PF) 1 % IJ SOLN
INTRAMUSCULAR | Status: AC
  Filled 2020-01-18: qty 30

## 2020-01-18 MED ORDER — FENTANYL CITRATE (PF) 100 MCG/2ML IJ SOLN
INTRAMUSCULAR | Status: DC | PRN
Start: 2020-01-18 — End: 2020-01-18
  Administered 2020-01-18: 25 ug via INTRAVENOUS

## 2020-01-18 MED ORDER — HEPARIN SODIUM (PORCINE) 1000 UNIT/ML IJ SOLN
INTRAMUSCULAR | Status: DC | PRN
  Administered 2020-01-18: 6000 [IU] via INTRAVENOUS

## 2020-01-18 MED ORDER — VERAPAMIL HCL 2.5 MG/ML IV SOLN
INTRAVENOUS | Status: DC | PRN
  Administered 2020-01-18: 10 mL via INTRA_ARTERIAL

## 2020-01-18 MED ORDER — HEPARIN (PORCINE) IN NACL 1000-0.9 UT/500ML-% IV SOLN
INTRAVENOUS | Status: DC | PRN
  Administered 2020-01-18 (×2): 500 mL

## 2020-01-18 MED ORDER — SODIUM CHLORIDE 0.9 % IV SOLN: 250.0000 mL | INTRAVENOUS | Status: DC | PRN

## 2020-01-18 MED ORDER — FENTANYL CITRATE (PF) 100 MCG/2ML IJ SOLN
INTRAMUSCULAR | Status: AC
  Filled 2020-01-18: qty 2

## 2020-01-18 MED ORDER — METFORMIN HCL 1000 MG PO TABS: 1000.0000 mg | ORAL_TABLET | ORAL | Status: AC

## 2020-01-18 MED ORDER — VERAPAMIL HCL 2.5 MG/ML IV SOLN
INTRAVENOUS | Status: AC
  Filled 2020-01-18: qty 2

## 2020-01-18 MED ORDER — ASPIRIN 81 MG PO CHEW: 81.0000 mg | CHEWABLE_TABLET | ORAL | Status: DC

## 2020-01-18 MED ORDER — HEPARIN SODIUM (PORCINE) 1000 UNIT/ML IJ SOLN
INTRAMUSCULAR | Status: AC
  Filled 2020-01-18: qty 1

## 2020-01-18 MED ORDER — SODIUM CHLORIDE 0.9% FLUSH: 3.0000 mL | Freq: Two times a day (BID) | INTRAVENOUS | Status: DC

## 2020-01-18 MED ORDER — HEPARIN (PORCINE) IN NACL 2000-0.9 UNIT/L-% IV SOLN
INTRAVENOUS | Status: AC
  Filled 2020-01-18: qty 1000

## 2020-01-18 MED ORDER — SODIUM CHLORIDE 0.9% FLUSH: 3.0000 mL | INTRAVENOUS | Status: DC | PRN

## 2020-01-18 MED ORDER — SODIUM CHLORIDE 0.9 % IV SOLN: INTRAVENOUS | Status: DC

## 2020-01-18 MED ORDER — IOHEXOL 350 MG/ML SOLN
INTRAVENOUS | Status: DC | PRN
  Administered 2020-01-18: 70 mL via INTRA_ARTERIAL

## 2020-01-18 MED ORDER — LIDOCAINE HCL (PF) 1 % IJ SOLN
INTRAMUSCULAR | Status: DC | PRN
  Administered 2020-01-18: 2 mL

## 2020-01-18 SURGICAL SUPPLY — 14 items
CATH 5FR JL3.5 JR4 ANG PIG MP (CATHETERS) ×1 IMPLANT
CATH SWAN DBL LUMAN 5F 110 (CATHETERS) ×1 IMPLANT
DEVICE RAD COMP TR BAND LRG (VASCULAR PRODUCTS) ×1 IMPLANT
GLIDESHEATH SLEND SS 6F .021 (SHEATH) ×2 IMPLANT
GUIDEWIRE INQWIRE 1.5J.035X260 (WIRE) IMPLANT
INQWIRE 1.5J .035X260CM (WIRE) ×2
KIT HEART LEFT (KITS) ×2 IMPLANT
PACK CARDIAC CATHETERIZATION (CUSTOM PROCEDURE TRAY) ×2 IMPLANT
SHEATH 6FR 75 DEST SLENDER (SHEATH) ×1 IMPLANT
SHEATH PROBE COVER 6X72 (BAG) ×1 IMPLANT
SYR 8ML ANGIOGRAPH HI-PRES (SYRINGE) ×1 IMPLANT
TRANSDUCER W/STOPCOCK (MISCELLANEOUS) ×2 IMPLANT
TUBING CIL FLEX 10 FLL-RA (TUBING) ×2 IMPLANT
WIRE HI TORQ VERSACORE-J 145CM (WIRE) ×1 IMPLANT

## 2020-01-18 NOTE — Interval H&P Note (Signed)
Cath Lab Visit (complete for each Cath Lab visit)  Clinical Evaluation Leading to the Procedure:   ACS: No.  Non-ACS:    Anginal Classification: CCS III  Anti-ischemic medical therapy: Minimal Therapy (1 class of medications)  Non-Invasive Test Results: No non-invasive testing performed  Prior CABG: No previous CABG      History and Physical Interval Note:  01/18/2020 10:49 AM  Burnis Medin.  has presented today for surgery, with the diagnosis of cad - heart failure.  The various methods of treatment have been discussed with the patient and family. After consideration of risks, benefits and other options for treatment, the patient has consented to  Procedure(s): RIGHT/LEFT HEART CATH AND CORONARY ANGIOGRAPHY (N/A) as a surgical intervention.  The patient's history has been reviewed, patient examined, no change in status, stable for surgery.  I have reviewed the patient's chart and labs.  Questions were answered to the patient's satisfaction.     Larae Grooms

## 2020-01-18 NOTE — Research (Signed)
[facility] Informed Consent   Subject Name: Christopher Hernandez.  Subject met inclusion and exclusion criteria.  The informed consent form, study requirements and expectations were reviewed with the subject and questions and concerns were addressed prior to the signing of the consent form.  The subject verbalized understanding of the trial requirements.  The subject agreed to participate in the [facility] trial and signed the informed consent at 1010 on 01/18/2020.  The informed consent was obtained prior to performance of any protocol-specific procedures for the subject.  A copy of the signed informed consent was given to the subject and a copy was placed in the subject's medical record.   Leah Thornberry

## 2020-01-18 NOTE — Discharge Instructions (Signed)
Radial Site Care  This sheet gives you information about how to care for yourself after your procedure. Your health care provider may also give you more specific instructions. If you have problems or questions, contact your health care provider. What can I expect after the procedure? After the procedure, it is common to have:  Bruising and tenderness at the catheter insertion area. Follow these instructions at home: Medicines  Take over-the-counter and prescription medicines only as told by your health care provider. Insertion site care  Follow instructions from your health care provider about how to take care of your insertion site. Make sure you: ? Wash your hands with soap and water before you change your bandage (dressing). If soap and water are not available, use hand sanitizer. ? Change your dressing as told by your health care provider. ? Leave stitches (sutures), skin glue, or adhesive strips in place. These skin closures may need to stay in place for 2 weeks or longer. If adhesive strip edges start to loosen and curl up, you may trim the loose edges. Do not remove adhesive strips completely unless your health care provider tells you to do that.  Check your insertion site every day for signs of infection. Check for: ? Redness, swelling, or pain. ? Fluid or blood. ? Pus or a bad smell. ? Warmth.  Do not take baths, swim, or use a hot tub until your health care provider approves.  You may shower 24-48 hours after the procedure, or as directed by your health care provider. ? Remove the dressing and gently wash the site with plain soap and water. ? Pat the area dry with a clean towel. ? Do not rub the site. That could cause bleeding.  Do not apply powder or lotion to the site. Activity   For 24 hours after the procedure, or as directed by your health care provider: ? Do not flex or bend the affected arm. ? Do not push or pull heavy objects with the affected arm. ? Do not  drive yourself home from the hospital or clinic. You may drive 24 hours after the procedure unless your health care provider tells you not to. ? Do not operate machinery or power tools.  Do not lift anything that is heavier than 10 lb (4.5 kg), or the limit that you are told, until your health care provider says that it is safe.  Ask your health care provider when it is okay to: ? Return to work or school. ? Resume usual physical activities or sports. ? Resume sexual activity. General instructions  If the catheter site starts to bleed, raise your arm and put firm pressure on the site. If the bleeding does not stop, get help right away. This is a medical emergency.  If you went home on the same day as your procedure, a responsible adult should be with you for the first 24 hours after you arrive home.  Keep all follow-up visits as told by your health care provider. This is important. Contact a health care provider if:  You have a fever.  You have redness, swelling, or yellow drainage around your insertion site. Get help right away if:  You have unusual pain at the radial site.  The catheter insertion area swells very fast.  The insertion area is bleeding, and the bleeding does not stop when you hold steady pressure on the area.  Your arm or hand becomes pale, cool, tingly, or numb. These symptoms may represent a serious problem   that is an emergency. Do not wait to see if the symptoms will go away. Get medical help right away. Call your local emergency services (911 in the [place] Do not drive yourself to the hospital. Summary  After the procedure, it is common to have bruising and tenderness at the site.  Follow instructions from your health care provider about how to take care of your radial site wound. Check the wound every day for signs of infection.  Do not lift anything that is heavier than 10 lb (4.5 kg), or the limit that you are told, until your health care provider says  that it is safe. This information is not intended to replace advice given to you by your health care provider. Make sure you discuss any questions you have with your health care provider. Document Revised: 04/27/2017 Document Reviewed: 04/27/2017 Elsevier Patient Education  2020 [employer]  

## 2020-01-21 ENCOUNTER — Encounter (HOSPITAL_COMMUNITY): Payer: Self-pay | Admitting: Interventional Cardiology

## 2020-01-21 DIAGNOSIS — I5032 Chronic diastolic (congestive) heart failure: Secondary | ICD-10-CM | POA: Diagnosis not present

## 2020-01-21 DIAGNOSIS — E782 Mixed hyperlipidemia: Secondary | ICD-10-CM | POA: Diagnosis not present

## 2020-01-21 DIAGNOSIS — I10 Essential (primary) hypertension: Secondary | ICD-10-CM | POA: Diagnosis not present

## 2020-01-21 DIAGNOSIS — I110 Hypertensive heart disease with heart failure: Secondary | ICD-10-CM | POA: Diagnosis not present

## 2020-01-21 DIAGNOSIS — I200 Unstable angina: Secondary | ICD-10-CM | POA: Diagnosis not present

## 2020-01-21 DIAGNOSIS — R69 Illness, unspecified: Secondary | ICD-10-CM | POA: Diagnosis not present

## 2020-01-21 DIAGNOSIS — E1169 Type 2 diabetes mellitus with other specified complication: Secondary | ICD-10-CM | POA: Diagnosis not present

## 2020-01-21 DIAGNOSIS — E1165 Type 2 diabetes mellitus with hyperglycemia: Secondary | ICD-10-CM | POA: Diagnosis not present

## 2020-02-01 ENCOUNTER — Ambulatory Visit: Payer: Medicare HMO | Admitting: Interventional Cardiology

## 2020-02-05 ENCOUNTER — Other Ambulatory Visit: Payer: Self-pay

## 2020-02-05 ENCOUNTER — Ambulatory Visit: Payer: Medicare HMO | Admitting: Pulmonary Disease

## 2020-02-05 ENCOUNTER — Encounter: Payer: Self-pay | Admitting: Pulmonary Disease

## 2020-02-05 VITALS — BP 124/72 | HR 69 | Temp 97.5°F | Ht 67.0 in | Wt 269.8 lb

## 2020-02-05 DIAGNOSIS — R0609 Other forms of dyspnea: Secondary | ICD-10-CM

## 2020-02-05 DIAGNOSIS — G4733 Obstructive sleep apnea (adult) (pediatric): Secondary | ICD-10-CM

## 2020-02-05 DIAGNOSIS — R0600 Dyspnea, unspecified: Secondary | ICD-10-CM | POA: Diagnosis not present

## 2020-02-05 NOTE — Progress Notes (Deleted)
Cardiology Office Note   Date:  02/05/2020   ID:  Christopher Sheriff Mykal Batiz., DOB [birth year], MRN [id number]  PCP:  [name], MD    No chief complaint on file.  Chronic diastolic heart failure  Wt Readings from Last 3 Encounters:  02/05/20 269 lb 12.8 oz (122.4 kg)  01/18/20 267 lb (121.1 kg)  01/16/20 272 lb 3.2 oz (123.5 kg)       History of Present Illness: Christopher Hernandez. is a 74 y.o. male  who is being seen today fordiastolic HF, medication monitoring.  Cardiac risk factors include HTN, HLD, DM and obesity. Also a h/o OSA, on CPAP therapy.In 2018, hecomplained of exertional dyspnea, fatigue and some chest tightness. He was set up for elective LHC, performed 03/24/17. He was found to have nonobstructive CAD (10-25% disease in RCA, LAD and LCx). EF was normal at 55-60%. LVEDP was mildly elevated. He was discharged home with plans to continue medical therapy of cardiac risk factors for further prevention.   On 03/31/17, he called the office with complaints of continued dyspnea, in addition to new LEE. He wastoldto start Lasix 40 mg in addition to KDur, 10 mEq daily.   Lisinopril was started for BP and readings were improved in Dec 2020.  He was in the hospital at [facility] and was admitted in 10/21.  Ruled out for MI.  Stress test showed:  "No T wave inversion was noted during stress.  There was no ST segment deviation noted during stress.  The study is normal.  This is a low risk study.  The left ventricular ejection fraction is normal (55-65%).  No prior study for comparison  Normal perfusion, with mild apical thinning at rest that improves with stress images. EF calculated at 50% but visually appears normal. Low risk stress test, no ischemia or high risk findings noted."  Using CPAP at night.   10/21 cath showed: "Ost LAD to Prox LAD lesion is 10% stenosed.  Mid Cx lesion is 25% stenosed.  Mid RCA lesion is 10% stenosed.  Mid LAD to Dist  LAD lesion is 50% stenosed. The distal LAD is a small vessel.  The left ventricular systolic function is normal.  LV end diastolic pressure is normal.  The left ventricular ejection fraction is 55-65% by visual estimate.  There is no aortic valve stenosis.  Ao sat 92%, PA sat 68%, PA pressure 24/6, mean PA pressure 13 mm Hg; mean PCWP 8 mm Hg; CO 7.6 L/min; CI 3.2   Continue aggressive preventive therapy.    He has sleep apnea based on his response to anesthesia.    Continued efforts at weight loss.  WIll defer to Dr. Moreen Fowler if he feels referral to pulmonary, or PFTs would be beneficial. "    Past Medical History:  Diagnosis Date  . BMI 40.0-44.9, adult ([place])   . Chest pain at rest   . Coronary artery disease, non-occlusive   . Diabetes mellitus   . Hyperlipemia   . Hypertension   . OSA on CPAP   . Peptic ulcer disease   . Recurrent major depressive disorder ([place])   . Rhinitis, allergic   . Routine general medical examination at a health care facility   . Xerosis of skin     Past Surgical History:  Procedure Laterality Date  . LEFT HEART CATH AND CORONARY ANGIOGRAPHY N/A 03/24/2017   Procedure: LEFT HEART CATH AND CORONARY ANGIOGRAPHY;  Surgeon: Jettie Booze, MD;  Location: [facility]  INVASIVE CV LAB;  Service: Cardiovascular;  Laterality: N/A;  . RIGHT/LEFT HEART CATH AND CORONARY ANGIOGRAPHY N/A 01/18/2020   Procedure: RIGHT/LEFT HEART CATH AND CORONARY ANGIOGRAPHY;  Surgeon: Jettie Booze, MD;  Location: [facility] CV LAB;  Service: Cardiovascular;  Laterality: N/A;     Current Outpatient Medications  Medication Sig Dispense Refill  . aspirin 81 MG tablet Take 81 mg by mouth daily.    . fluticasone (FLONASE) 50 MCG/ACT nasal spray Place into both nostrils daily.    Marland Kitchen glimepiride (AMARYL) 2 MG tablet Take 2 mg by mouth every morning.    Marland Kitchen lisinopril (ZESTRIL) 20 MG tablet Take 10 mg by mouth at bedtime.     . metFORMIN (GLUCOPHAGE) 1000 MG tablet Take 1  tablet (1,000 mg total) by mouth every other day.    . sertraline (ZOLOFT) 100 MG tablet Take 100 mg by mouth at bedtime.     . simvastatin (ZOCOR) 40 MG tablet Take 40 mg by mouth every evening.     No current facility-administered medications for this visit.    Allergies:   Patient has no known allergies.    Social History:  The patient  reports that he has never smoked. He has never used smokeless tobacco. He reports that he does not drink alcohol and does not use drugs.   Family History:  The patient's ***family history includes Cirrhosis in his father; Diabetes Mellitus I in his mother; Hypertension in his mother; Liver disease in his father.    ROS:  Please see the history of present illness.   Otherwise, review of systems are positive for ***.   All other systems are reviewed and negative.    PHYSICAL EXAM: VS:  There were no vitals taken for this visit. , BMI There is no height or weight on file to calculate BMI. GEN: Well nourished, well developed, in no acute distress HEENT: normal Neck: no JVD, carotid bruits, or masses Cardiac: ***RRR; no murmurs, rubs, or gallops,no edema  Respiratory:  clear to auscultation bilaterally, normal work of breathing GI: soft, nontender, nondistended, + BS MS: no deformity or atrophy Skin: warm and dry, no rash Neuro:  Strength and sensation are intact Psych: euthymic mood, full affect   EKG:   The ekg ordered today demonstrates ***   Recent Labs: 01/05/2020: B Natriuretic Peptide 77.1 01/06/2020: BUN 19; Creatinine, Ser 0.77; Platelets 194 01/18/2020: Hemoglobin 12.2; Potassium 4.0; Sodium 144   Lipid Panel    Component Value Date/Time   CHOL 120 01/04/2020 1124   TRIG 177 (H) 01/04/2020 1124   HDL 21 (L) 01/04/2020 1124   CHOLHDL 5.7 01/04/2020 1124   VLDL 35 01/04/2020 1124   LDLCALC 64 01/04/2020 1124     Other studies Reviewed: Additional studies/ records that were reviewed today with results demonstrating:  ***.   ASSESSMENT AND PLAN:  1. Chronic diastolic heart failure:  2. HTN: 3. Bilateral leg edema:   Current medicines are reviewed at length with the patient today.  The patient concerns regarding his medicines were addressed.  The following changes have been made:  No change***  Labs/ tests ordered today include: *** No orders of the defined types were placed in this encounter.   Recommend 150 minutes/week of aerobic exercise Low fat, low carb, high fiber diet recommended  Disposition:   FU in ***   Signed, Larae Grooms, MD  02/05/2020 6:10 PM    [facility] [place], [place], [place]  [address]  Phone: [phone]; Fax: [phone]

## 2020-02-05 NOTE — Patient Instructions (Signed)
We will schedule you for a CPAP titration study We will schedule you for Pulmonary function testing

## 2020-02-05 NOTE — Progress Notes (Signed)
Synopsis: Referred by [name], MD for exertional shortness of breath  Subjective:   PATIENT ID: Christopher Hernandez. GENDER: male DOB: [birth year], MRN: [id number]   HPI  Chief Complaint  Patient presents with  . Consult    pt has sob on exertion   Christopher Hernandez. Is a 74 year old male, never smoker with obesity, hypertension, hyperlipidemia, diabetes mellitus, HFpEF and obstructive sleep apnea on CPAP who is referred to pulmonary clinic for exertional dyspnea.   He reports developing exertional shortness of breath over the past 3-4 months. He notes episodes where he is exerting himself at work or around the house where he comes very short of breath a fatigued where he has to sit down and rest. He reports being able to rest for 10-15 minutes and then is able to return to his activity. He has had these episodes where he has been so fatigued that he has fallen asleep in his chair when he has come in to rest. He also reports occasional chest discomfort and diaphoresis with these episodes.   He has under gone cardiac echo, stress test and heart cath last month. Echo showed normal EF and normal RV size and systolic function. The heart cath showed non-obstructive CAD and Ao sat 92%, PA sat 68%, PA pressure 24/6, mean PA pressure 13 mm Hg; mean PCWP 8 mm Hg; CO 7.6 L/min; CI 3.2.  Chest radiograph on 01/03/20 did not indicate any acute abnormalities.   He has been compliant with his CPAP which has been on since the 1990s. He follows with Dr. Maxwell Caul Noticed this summer that he was having increasing shortness of breath and sweating.   Shortness of breath and weakness.   Sleeps hard. Uses CPAP machine since 1990s. Sees Dr. Nehemiah Settle for his OSA. He has not had a sleep study performed since he was initially diagnosed. He has been updating his CPAP machine every couple of years.  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 He denies exposures to harmful dusts or chemicals.  Past Medical History:  Diagnosis Date  . BMI 40.0-44.9, adult ([place])   . Chest pain at rest   . Coronary artery disease, non-occlusive   . Diabetes mellitus   . Hyperlipemia   . Hypertension   . OSA on CPAP   . Peptic ulcer disease   . Recurrent major depressive disorder ([place])   . Rhinitis, allergic   . Routine general medical examination at a health care facility   . Xerosis of skin      Family History  Problem Relation Age of Onset  . Cirrhosis Father   . Liver disease Father   . Hypertension Mother   . Diabetes Mellitus I Mother   . Colon cancer Neg Hx   . Prostate cancer Neg Hx   . Testicular cancer Neg Hx      Social History   Socioeconomic History  . Marital status: Married    Spouse name: Not on file  . Number of children: Not on file  . Years of education: Not on file  . Highest education level: Not on file  Occupational History  . Not on file  Tobacco Use  . Smoking status: Never Smoker  . Smokeless tobacco: Never Used  . Tobacco comment: TRIED AS A TEENAGER  Substance and Sexual Activity  . Alcohol use: No  . Drug use: No  .  Sexual activity: Not on file  Other Topics Concern  . Not on file  Social History Narrative  . Not on file   Social Determinants of Health   Financial Resource Strain:   . Difficulty of Paying Living Expenses: Not on file  Food Insecurity:   . Worried About Charity fundraiser in the Last Year: Not on file  . Ran Out of Food in the Last Year: Not on file  Transportation Needs:   . Lack of Transportation (Medical): Not on file  . Lack of Transportation (Non-Medical): Not on file  Physical Activity:   . Days of Exercise per Week: Not on file  . Minutes of Exercise per Session: Not on file  Stress:   . Feeling of Stress : Not on file  Social Connections:   . Frequency of Communication with Friends and Family: Not on file  . Frequency of Social  Gatherings with Friends and Family: Not on file  . Attends Religious Services: Not on file  . Active Member of Clubs or Organizations: Not on file  . Attends Archivist Meetings: Not on file  . Marital Status: Not on file  Intimate Partner Violence:   . Fear of Current or Ex-Partner: Not on file  . Emotionally Abused: Not on file  . Physically Abused: Not on file  . Sexually Abused: Not on file     No Known Allergies   Outpatient Medications Prior to Visit  Medication Sig Dispense Refill  . aspirin 81 MG tablet Take 81 mg by mouth daily.    . fluticasone (FLONASE) 50 MCG/ACT nasal spray Place into both nostrils daily.    Marland Kitchen glimepiride (AMARYL) 2 MG tablet Take 2 mg by mouth every morning.    Marland Kitchen lisinopril (ZESTRIL) 20 MG tablet Take 10 mg by mouth at bedtime.     . metFORMIN (GLUCOPHAGE) 1000 MG tablet Take 1 tablet (1,000 mg total) by mouth every other day.    . sertraline (ZOLOFT) 100 MG tablet Take 100 mg by mouth at bedtime.     . simvastatin (ZOCOR) 40 MG tablet Take 40 mg by mouth every evening.     No facility-administered medications prior to visit.    Review of Systems  Constitutional: Positive for malaise/fatigue. Negative for chills, diaphoresis, fever and weight loss.  HENT: Negative for congestion, sinus pain and sore throat.   Eyes: Negative for blurred vision.  Respiratory: Positive for shortness of breath. Negative for cough, hemoptysis, sputum production and wheezing.   Cardiovascular: Positive for chest pain. Negative for palpitations, orthopnea, claudication, leg swelling and PND.  Gastrointestinal: Negative for abdominal pain, blood in stool, constipation, diarrhea, heartburn, melena, nausea and vomiting.  Genitourinary: Negative for dysuria and hematuria.  Musculoskeletal: Negative for joint pain.  Skin: Negative for itching and rash.  Neurological: Negative for dizziness, weakness and headaches.  Endo/Heme/Allergies: Does not bruise/bleed easily.    Psychiatric/Behavioral: Negative.    Objective:   Vitals:   02/05/20 1609  BP: 124/72  Pulse: 69  Temp: (!) 97.5 F (36.4 C)  TempSrc: Oral  SpO2: 95%  Weight: 269 lb 12.8 oz (122.4 kg)  Height: 5\' 7"  (1.702 m)     Physical Exam Constitutional:      General: He is not in acute distress.    Appearance: He is obese. He is not ill-appearing.  HENT:     Head: Normocephalic and atraumatic.     Nose: Nose normal.     Mouth/Throat:  Mouth: Mucous membranes are moist.     Pharynx: Oropharynx is clear.  Eyes:     General: No scleral icterus.    Conjunctiva/sclera: Conjunctivae normal.     Pupils: Pupils are equal, round, and reactive to light.  Cardiovascular:     Rate and Rhythm: Normal rate and regular rhythm.     Pulses: Normal pulses.     Heart sounds: Normal heart sounds. No murmur heard.   Pulmonary:     Breath sounds: Decreased air movement present. No wheezing, rhonchi or rales.  Abdominal:     General: Bowel sounds are normal.     Palpations: Abdomen is soft.  Musculoskeletal:     Right lower leg: No edema.     Left lower leg: No edema.  Skin:    Capillary Refill: Capillary refill takes less than 2 seconds.     Findings: No lesion or rash.  Neurological:     General: No focal deficit present.     Mental Status: He is alert and oriented to person, place, and time. Mental status is at baseline.     Gait: Gait normal.  Psychiatric:        Mood and Affect: Mood normal.        Behavior: Behavior normal.        Thought Content: Thought content normal.        Judgment: Judgment normal.     CBC    Component Value Date/Time   WBC 4.9 01/06/2020 0512   RBC 4.30 01/06/2020 0512   HGB 12.2 (L) 01/18/2020 1109   HGB 13.7 03/21/2017 1035   HCT 36.0 (L) 01/18/2020 1109   HCT 41.3 03/21/2017 1035   PLT 194 01/06/2020 0512   PLT 277 03/21/2017 1035   MCV 89.3 01/06/2020 0512   MCV 91 03/21/2017 1035   MCH 29.1 01/06/2020 0512   MCHC 32.6 01/06/2020 0512    RDW 14.0 01/06/2020 0512   RDW 13.2 03/21/2017 1035   BMP Latest Ref Rng & Units 01/18/2020 01/18/2020 01/06/2020  Glucose 70 - 99 mg/dL - - 122(H)  BUN 8 - 23 mg/dL - - 19  Creatinine 0.61 - 1.24 mg/dL - - 0.77  BUN/Creat Ratio 10 - 24 - - -  Sodium 135 - 145 mmol/L 144 144 140  Potassium 3.5 - 5.1 mmol/L 4.0 4.0 4.0  Chloride 98 - 111 mmol/L - - 104  CO2 22 - 32 mmol/L - - 27  Calcium 8.9 - 10.3 mg/dL - - 8.9    Chest imaging:  CXR 01/03/20 The heart size and mediastinal contours are within normal limits. Both lungs are clear. The visualized skeletal structures are Unremarkable.  PFT: None on file  Labs: Reviewed as above.  Echo: 01/04/20 1. Left ventricular ejection fraction, by estimation, is 60 to 65%. The  left ventricle has normal function. The left ventricle has no regional  wall motion abnormalities. Left ventricular diastolic parameters are  indeterminate.  2. Right ventricular systolic function is normal. The right ventricular  size is normal. There is normal pulmonary artery systolic pressure. The  estimated right ventricular systolic pressure is 09.7 mmHg.  3. Left atrial size was mildly dilated.    NM Myocardial Stress Test 01/05/20  No T wave inversion was noted during stress.  There was no ST segment deviation noted during stress.  The study is normal.  This is a low risk study.  The left ventricular ejection fraction is normal (55-65%).  No prior study for comparison  Normal perfusion, with mild apical thinning at rest that improves with stress images. EF calculated at 50% but visually appears normal. Low risk stress test, no ischemia or high risk findings noted.  Cardiac Catheterization: 01/18/20  Ost LAD to Prox LAD lesion is 10% stenosed.  Mid Cx lesion is 25% stenosed.  Mid RCA lesion is 10% stenosed.  Mid LAD to Dist LAD lesion is 50% stenosed. The distal LAD is a small vessel.  The left ventricular systolic function is normal.  LV  end diastolic pressure is normal.  The left ventricular ejection fraction is 55-65% by visual estimate.  There is no aortic valve stenosis.  Ao sat 92%, PA sat 68%, PA pressure 24/6, mean PA pressure 13 mm Hg; mean PCWP 8 mm Hg; CO 7.6 L/min; CI 3.2    Assessment & Plan:   Dyspnea on exertion - Plan: Pulmonary Function Test  Obstructive sleep apnea - Plan: Cpap titration  Obesity, Class III, BMI 40-49.9 (morbid obesity) (HCC)  Discussion: Christopher Hernandez. Is a 74 year old male, never smoker with obesity, hypertension, hyperlipidemia, diabetes mellitus, HFpEF and obstructive sleep apnea on CPAP who is referred to pulmonary clinic for exertional dyspnea.   The etiology of his exertional dyspnea is not clear at this time. He has undergone thorough cardiac evaluation recently without overt evidence of cardiac origin other than underlying HFpEF that could benefit from trial of lasix therapy.  He does not have oxygen desaturations after walking 3 laps in clinic today.   We will order pulmonary function testing and schedule him for a CPAP titration study to ensure his CPAP is providing adequate support. We will determine if inhaler therapy is warranted after the pulmonary function testing. If pulmonary function tests are not concerning, then a trial of lasix therapy will be the next step in treatment related to HFpEF.    We discussed how his weight can be adding to the exertional dyspnea and that he would benefit from weight loss.   Follow up in 3 months  Christopher Jackson, MD [facility] Office: [phone]     Current Outpatient Medications:  .  aspirin 81 MG tablet, Take 81 mg by mouth daily., Disp: , Rfl:  .  fluticasone (FLONASE) 50 MCG/ACT nasal spray, Place into both nostrils daily., Disp: , Rfl:  .  glimepiride (AMARYL) 2 MG tablet, Take 2 mg by mouth every morning., Disp: , Rfl:  .  lisinopril (ZESTRIL) 20 MG tablet, Take 10 mg by mouth at bedtime. ,  Disp: , Rfl:  .  metFORMIN (GLUCOPHAGE) 1000 MG tablet, Take 1 tablet (1,000 mg total) by mouth every other day., Disp: , Rfl:  .  sertraline (ZOLOFT) 100 MG tablet, Take 100 mg by mouth at bedtime. , Disp: , Rfl:  .  simvastatin (ZOCOR) 40 MG tablet, Take 40 mg by mouth every evening., Disp: , Rfl:

## 2020-02-06 ENCOUNTER — Ambulatory Visit: Payer: Medicare HMO | Admitting: Interventional Cardiology

## 2020-02-06 DIAGNOSIS — I10 Essential (primary) hypertension: Secondary | ICD-10-CM

## 2020-02-06 DIAGNOSIS — R600 Localized edema: Secondary | ICD-10-CM

## 2020-02-06 DIAGNOSIS — I5032 Chronic diastolic (congestive) heart failure: Secondary | ICD-10-CM

## 2020-02-08 ENCOUNTER — Ambulatory Visit (INDEPENDENT_AMBULATORY_CARE_PROVIDER_SITE_OTHER): Payer: Medicare HMO | Admitting: Pulmonary Disease

## 2020-02-08 ENCOUNTER — Other Ambulatory Visit: Payer: Self-pay

## 2020-02-08 DIAGNOSIS — R0600 Dyspnea, unspecified: Secondary | ICD-10-CM | POA: Diagnosis not present

## 2020-02-08 DIAGNOSIS — G4733 Obstructive sleep apnea (adult) (pediatric): Secondary | ICD-10-CM | POA: Diagnosis not present

## 2020-02-08 DIAGNOSIS — R0609 Other forms of dyspnea: Secondary | ICD-10-CM

## 2020-02-08 LAB — PULMONARY FUNCTION TEST
DL/VA % pred: 100 %
DL/VA: 4.05 ml/min/mmHg/L
DLCO cor % pred: 107 %
DLCO cor: 25.85 ml/min/mmHg
DLCO unc % pred: 100 %
DLCO unc: 24.17 ml/min/mmHg
FEF 25-75 Post: 3.67 L/sec
FEF 25-75 Pre: 3.89 L/sec
FEF2575-%Change-Post: -5 %
FEF2575-%Pred-Post: 172 %
FEF2575-%Pred-Pre: 182 %
FEV1-%Change-Post: 0 %
FEV1-%Pred-Post: 132 %
FEV1-%Pred-Pre: 131 %
FEV1-Post: 3.86 L
FEV1-Pre: 3.83 L
FEV1FVC-%Change-Post: 4 %
FEV1FVC-%Pred-Pre: 110 %
FEV6-%Change-Post: -2 %
FEV6-%Pred-Post: 119 %
FEV6-%Pred-Pre: 122 %
FEV6-Post: 4.52 L
FEV6-Pre: 4.64 L
FEV6FVC-%Change-Post: 1 %
FEV6FVC-%Pred-Post: 105 %
FEV6FVC-%Pred-Pre: 104 %
FVC-%Change-Post: -3 %
FVC-%Pred-Post: 114 %
FVC-%Pred-Pre: 118 %
FVC-Post: 4.59 L
FVC-Pre: 4.76 L
Post FEV1/FVC ratio: 84 %
Post FEV6/FVC ratio: 99 %
Pre FEV1/FVC ratio: 80 %
Pre FEV6/FVC Ratio: 98 %

## 2020-02-08 NOTE — Progress Notes (Signed)
Spirometry pre and post and Dlco done today. ?

## 2020-02-12 ENCOUNTER — Encounter (HOSPITAL_BASED_OUTPATIENT_CLINIC_OR_DEPARTMENT_OTHER): Payer: Self-pay

## 2020-02-15 DIAGNOSIS — R350 Frequency of micturition: Secondary | ICD-10-CM | POA: Diagnosis not present

## 2020-02-20 ENCOUNTER — Institutional Professional Consult (permissible substitution): Payer: Medicare HMO | Admitting: Pulmonary Disease

## 2020-02-25 DIAGNOSIS — I5032 Chronic diastolic (congestive) heart failure: Secondary | ICD-10-CM | POA: Diagnosis not present

## 2020-02-25 DIAGNOSIS — E782 Mixed hyperlipidemia: Secondary | ICD-10-CM | POA: Diagnosis not present

## 2020-02-25 DIAGNOSIS — I10 Essential (primary) hypertension: Secondary | ICD-10-CM | POA: Diagnosis not present

## 2020-02-25 DIAGNOSIS — E1169 Type 2 diabetes mellitus with other specified complication: Secondary | ICD-10-CM | POA: Diagnosis not present

## 2020-02-25 DIAGNOSIS — R69 Illness, unspecified: Secondary | ICD-10-CM | POA: Diagnosis not present

## 2020-02-25 DIAGNOSIS — E1165 Type 2 diabetes mellitus with hyperglycemia: Secondary | ICD-10-CM | POA: Diagnosis not present

## 2020-02-25 DIAGNOSIS — I200 Unstable angina: Secondary | ICD-10-CM | POA: Diagnosis not present

## 2020-02-25 DIAGNOSIS — I110 Hypertensive heart disease with heart failure: Secondary | ICD-10-CM | POA: Diagnosis not present

## 2020-03-03 ENCOUNTER — Institutional Professional Consult (permissible substitution): Payer: Medicare HMO | Admitting: Internal Medicine

## 2020-03-09 DIAGNOSIS — G4733 Obstructive sleep apnea (adult) (pediatric): Secondary | ICD-10-CM | POA: Diagnosis not present

## 2020-03-14 DIAGNOSIS — I200 Unstable angina: Secondary | ICD-10-CM | POA: Diagnosis not present

## 2020-03-14 DIAGNOSIS — E782 Mixed hyperlipidemia: Secondary | ICD-10-CM | POA: Diagnosis not present

## 2020-03-14 DIAGNOSIS — I5032 Chronic diastolic (congestive) heart failure: Secondary | ICD-10-CM | POA: Diagnosis not present

## 2020-03-14 DIAGNOSIS — B349 Viral infection, unspecified: Secondary | ICD-10-CM | POA: Diagnosis not present

## 2020-03-14 DIAGNOSIS — I10 Essential (primary) hypertension: Secondary | ICD-10-CM | POA: Diagnosis not present

## 2020-03-14 DIAGNOSIS — R69 Illness, unspecified: Secondary | ICD-10-CM | POA: Diagnosis not present

## 2020-03-14 DIAGNOSIS — E1169 Type 2 diabetes mellitus with other specified complication: Secondary | ICD-10-CM | POA: Diagnosis not present

## 2020-03-14 DIAGNOSIS — E1165 Type 2 diabetes mellitus with hyperglycemia: Secondary | ICD-10-CM | POA: Diagnosis not present

## 2020-03-14 DIAGNOSIS — U071 COVID-19: Secondary | ICD-10-CM | POA: Diagnosis not present

## 2020-03-16 ENCOUNTER — Other Ambulatory Visit (HOSPITAL_COMMUNITY): Payer: Self-pay | Admitting: Family

## 2020-03-16 DIAGNOSIS — U071 COVID-19: Secondary | ICD-10-CM

## 2020-03-16 NOTE — Progress Notes (Signed)
I connected by phone with Christopher Hernandez. on 03/16/2020 at 11:36 AM to discuss the potential use of a new treatment for mild to moderate COVID-19 viral infection in non-hospitalized patients.  This patient is a 74 y.o. male that meets the FDA criteria for Emergency Use Authorization of COVID monoclonal antibody casirivimab/imdevimab, bamlanivimab/eteseviamb, or sotrovimab.  Has a (+) direct SARS-CoV-2 viral test result  Has mild or moderate COVID-19   Is NOT hospitalized due to COVID-19  Is within 10 days of symptom onset  Has at least one of the high risk factor(s) for progression to severe COVID-19 and/or hospitalization as defined in EUA.  Specific high risk criteria : Older age (>/= 74 yo), BMI > 25, Diabetes and Cardiovascular disease or hypertension   Symptoms of cough, cold, and SOB began 03/12/20.   I have spoken and communicated the following to the patient or parent/caregiver regarding COVID monoclonal antibody treatment:  1. FDA has authorized the emergency use for the treatment of mild to moderate COVID-19 in adults and pediatric patients with positive results of direct SARS-CoV-2 viral testing who are 89 years of age and older weighing at least 40 kg, and who are at high risk for progressing to severe COVID-19 and/or hospitalization.  2. The significant known and potential risks and benefits of COVID monoclonal antibody, and the extent to which such potential risks and benefits are unknown.  3. Information on available alternative treatments and the risks and benefits of those alternatives, including clinical trials.  4. Patients treated with COVID monoclonal antibody should continue to self-isolate and use infection control measures (e.g., wear mask, isolate, social distance, avoid sharing personal items, clean and disinfect "high touch" surfaces, and frequent handwashing) according to CDC guidelines.   5. The patient or parent/caregiver has the option to accept or  refuse COVID monoclonal antibody treatment.  After reviewing this information with the patient, the patient has agreed to receive one of the available covid 19 monoclonal antibodies and will be provided an appropriate fact sheet prior to infusion. [name], NP 03/16/2020 11:36 AM

## 2020-03-17 ENCOUNTER — Other Ambulatory Visit (HOSPITAL_COMMUNITY): Payer: Self-pay

## 2020-03-17 ENCOUNTER — Encounter (HOSPITAL_BASED_OUTPATIENT_CLINIC_OR_DEPARTMENT_OTHER): Payer: Medicare HMO | Admitting: Pulmonary Disease

## 2020-03-18 ENCOUNTER — Ambulatory Visit (HOSPITAL_COMMUNITY)
Admission: RE | Admit: 2020-03-18 | Discharge: 2020-03-18 | Disposition: A | Discharge status: Home / Self Care | Payer: Medicare Other | Source: Ambulatory Visit | Attending: Pulmonary Disease | Admitting: Pulmonary Disease

## 2020-03-18 DIAGNOSIS — U071 COVID-19: Secondary | ICD-10-CM | POA: Insufficient documentation

## 2020-03-18 DIAGNOSIS — Z23 Encounter for immunization: Secondary | ICD-10-CM | POA: Insufficient documentation

## 2020-03-18 MED ORDER — EPINEPHRINE 0.3 MG/0.3ML IJ SOAJ: 0.3000 mg | Freq: Once | INTRAMUSCULAR | Status: DC | PRN

## 2020-03-18 MED ORDER — SODIUM CHLORIDE 0.9 % IV SOLN: Freq: Once | INTRAVENOUS | Status: AC

## 2020-03-18 MED ORDER — DIPHENHYDRAMINE HCL 50 MG/ML IJ SOLN: 50.0000 mg | Freq: Once | INTRAMUSCULAR | Status: DC | PRN

## 2020-03-18 MED ORDER — SODIUM CHLORIDE 0.9 % IV SOLN: INTRAVENOUS | Status: DC | PRN

## 2020-03-18 MED ORDER — FAMOTIDINE IN NACL 20-0.9 MG/50ML-% IV SOLN: 20.0000 mg | Freq: Once | INTRAVENOUS | Status: DC | PRN

## 2020-03-18 MED ORDER — METHYLPREDNISOLONE SODIUM SUCC 125 MG IJ SOLR: 125.0000 mg | Freq: Once | INTRAMUSCULAR | Status: DC | PRN

## 2020-03-18 MED ORDER — ALBUTEROL SULFATE HFA 108 (90 BASE) MCG/ACT IN AERS: 2.0000 | INHALATION_SPRAY | Freq: Once | RESPIRATORY_TRACT | Status: DC | PRN

## 2020-03-18 NOTE — Discharge Instructions (Signed)
10 Things You Can Do to Manage Your COVID-19 Symptoms at Home If you have possible or confirmed COVID-19: 1. Stay home from work and school. And stay away from other public places. If you must go out, avoid using any kind of public transportation, ridesharing, or taxis. 2. Monitor your symptoms carefully. If your symptoms get worse, call your healthcare provider immediately. 3. Get rest and stay hydrated. 4. If you have a medical appointment, call the healthcare provider ahead of time and tell them that you have or may have COVID-19. 5. For medical emergencies, call 911 and notify the dispatch personnel that you have or may have COVID-19. 6. Cover your cough and sneezes with a tissue or use the inside of your elbow. 7. Wash your hands often with soap and water for at least 20 seconds or clean your hands with an alcohol-based hand sanitizer that contains at least 60% alcohol. 8. As much as possible, stay in a specific room and away from other people in your home. Also, you should use a separate bathroom, if available. If you need to be around other people in or outside of the home, wear a mask. 9. Avoid sharing personal items with other people in your household, like dishes, towels, and bedding. 10. Clean all surfaces that are touched often, like counters, tabletops, and doorknobs. Use household cleaning sprays or wipes according to the label instructions. cdc.gov/coronavirus 10/04/2018 This information is not intended to replace advice given to you by your health care provider. Make sure you discuss any questions you have with your health care provider. Document Revised: 03/08/2019 Document Reviewed: 03/08/2019 Elsevier Patient Education  2020 Elsevier Inc. What types of side effects do monoclonal antibody drugs cause?  Common side effects  In general, the more common side effects caused by monoclonal antibody drugs include: . Allergic reactions, such as hives or itching . Flu-like signs and  symptoms, including chills, fatigue, fever, and muscle aches and pains . Nausea, vomiting . Diarrhea . Skin rashes . Low blood pressure   The CDC is recommending patients who receive monoclonal antibody treatments wait at least 90 days before being vaccinated.  Currently, there are no data on the safety and efficacy of mRNA COVID-19 vaccines in persons who received monoclonal antibodies or convalescent plasma as part of COVID-19 treatment. Based on the estimated half-life of such therapies as well as evidence suggesting that reinfection is uncommon in the 90 days after initial infection, vaccination should be deferred for at least 90 days, as a precautionary measure until additional information becomes available, to avoid interference of the antibody treatment with vaccine-induced immune responses. If you have any questions or concerns after the infusion please call the Advanced Practice Provider on call at [phone]. This number is ONLY intended for your use regarding questions or concerns about the infusion post-treatment side-effects.  Please do not provide this number to others for use. For return to work notes please contact your primary care provider.   If someone you know is interested in receiving treatment please have them call the COVID hotline at [phone].   

## 2020-03-18 NOTE — Progress Notes (Signed)
Patient reviewed Fact Sheet for Patients, Parents, and Caregivers for Emergency Use Authorization (EUA) of Bam/Ete for the Treatment of Coronavirus. Patient also reviewed and is agreeable to the estimated cost of treatment. Patient is agreeable to proceed.

## 2020-03-18 NOTE — Progress Notes (Signed)
  Diagnosis: COVID-19  Physician: Dr. Joya Gaskins  Procedure: [facility] Med: bamlanivimab\etesevimab infusion - Provided patient with bamlanimivab\etesevimab fact sheet for patients, parents and caregivers prior to infusion.  Complications: No immediate complications noted.  Discharge: Discharged home   Christopher Hernandez, Christopher Hernandez 03/18/2020

## 2020-03-21 ENCOUNTER — Ambulatory Visit: Payer: Medicare HMO | Admitting: Interventional Cardiology

## 2020-04-07 DIAGNOSIS — E1169 Type 2 diabetes mellitus with other specified complication: Secondary | ICD-10-CM | POA: Diagnosis not present

## 2020-04-07 DIAGNOSIS — E782 Mixed hyperlipidemia: Secondary | ICD-10-CM | POA: Diagnosis not present

## 2020-04-07 DIAGNOSIS — Z1389 Encounter for screening for other disorder: Secondary | ICD-10-CM | POA: Diagnosis not present

## 2020-04-07 DIAGNOSIS — J309 Allergic rhinitis, unspecified: Secondary | ICD-10-CM | POA: Diagnosis not present

## 2020-04-07 DIAGNOSIS — I5032 Chronic diastolic (congestive) heart failure: Secondary | ICD-10-CM | POA: Diagnosis not present

## 2020-04-07 DIAGNOSIS — I110 Hypertensive heart disease with heart failure: Secondary | ICD-10-CM | POA: Diagnosis not present

## 2020-04-07 DIAGNOSIS — G4730 Sleep apnea, unspecified: Secondary | ICD-10-CM | POA: Diagnosis not present

## 2020-04-07 DIAGNOSIS — R946 Abnormal results of thyroid function studies: Secondary | ICD-10-CM | POA: Diagnosis not present

## 2020-04-07 DIAGNOSIS — Z7984 Long term (current) use of oral hypoglycemic drugs: Secondary | ICD-10-CM | POA: Diagnosis not present

## 2020-04-07 DIAGNOSIS — Z8616 Personal history of COVID-19: Secondary | ICD-10-CM | POA: Diagnosis not present

## 2020-04-07 DIAGNOSIS — R69 Illness, unspecified: Secondary | ICD-10-CM | POA: Diagnosis not present

## 2020-04-07 DIAGNOSIS — Z0000 Encounter for general adult medical examination without abnormal findings: Secondary | ICD-10-CM | POA: Diagnosis not present

## 2020-04-11 ENCOUNTER — Ambulatory Visit: Payer: Medicare HMO | Admitting: Interventional Cardiology

## 2020-05-01 DIAGNOSIS — E1169 Type 2 diabetes mellitus with other specified complication: Secondary | ICD-10-CM | POA: Diagnosis not present

## 2020-05-01 DIAGNOSIS — I110 Hypertensive heart disease with heart failure: Secondary | ICD-10-CM | POA: Diagnosis not present

## 2020-05-01 DIAGNOSIS — E782 Mixed hyperlipidemia: Secondary | ICD-10-CM | POA: Diagnosis not present

## 2020-05-01 DIAGNOSIS — I200 Unstable angina: Secondary | ICD-10-CM | POA: Diagnosis not present

## 2020-05-01 DIAGNOSIS — I10 Essential (primary) hypertension: Secondary | ICD-10-CM | POA: Diagnosis not present

## 2020-05-01 DIAGNOSIS — I5032 Chronic diastolic (congestive) heart failure: Secondary | ICD-10-CM | POA: Diagnosis not present

## 2020-05-01 DIAGNOSIS — R69 Illness, unspecified: Secondary | ICD-10-CM | POA: Diagnosis not present

## 2020-05-21 NOTE — Progress Notes (Signed)
Cardiology Office Note   Date:  05/23/2020   ID:  Christopher Sheriff Christopher Banh., DOB 1945-06-14, MRN [id number]  PCP:  Christopher Contras, MD    No chief complaint on file.  Chronic diastolic heart failure  Wt Readings from Last 3 Encounters:  05/23/20 261 lb 6.4 oz (118.6 kg)  02/05/20 269 lb 12.8 oz (122.4 kg)  01/18/20 267 lb (121.1 kg)       History of Present Illness: Christopher Pound. is a 75 y.o. male  who is being seen today fordiastolic HF, medication monitoring,  Cardiac risk factors include HTN, HLD, DM and obesity. Also a h/o OSA, on CPAP therapy.In 2018, hecomplained of exertional dyspnea, fatigue and some chest tightness. He was set up for elective LHC, performed 03/24/17. He was found to have nonobstructive CAD (10-25% disease in RCA, LAD and LCx). EF was normal at 55-60%. LVEDP was mildly elevated. He was discharged home with plans to continue medical therapy of cardiac risk factors for further prevention.   On 03/31/17, he called the office with complaints of continued dyspnea, in addition to new LEE. He wastoldto start Lasix 40 mg in addition to KDur, 10 mEq daily.   Lisinopril was started for BP and readings were improved in Dec 2020.  He was in the hospital at [facility] and was admitted in 10/21.  Ruled out for MI.  Stress test showed:  "No T wave inversion was noted during stress.  There was no ST segment deviation noted during stress.  The study is normal.  This is a low risk study.  The left ventricular ejection fraction is normal (55-65%).  No prior study for comparison  Normal perfusion, with mild apical thinning at rest that improves with stress images. EF calculated at 50% but visually appears normal. Low risk stress test, no ischemia or high risk findings noted."  Using CPAP at night.  He continues to report that he has some DOE, chest tightness and sweating with walking.   Cath in 10/21 showed:  "Ost LAD to Prox LAD lesion is 10%  stenosed.  Mid Cx lesion is 25% stenosed.  Mid RCA lesion is 10% stenosed.  Mid LAD to Dist LAD lesion is 50% stenosed. The distal LAD is a small vessel.  The left ventricular systolic function is normal.  LV end diastolic pressure is normal.  The left ventricular ejection fraction is 55-65% by visual estimate.  There is no aortic valve stenosis.  Ao sat 92%, PA sat 68%, PA pressure 24/6, mean PA pressure 13 mm Hg; mean PCWP 8 mm Hg; CO 7.6 L/min; CI 3.2   Continue aggressive preventive therapy.    He has sleep apnea based on his response to anesthesia.    Continued efforts at weight loss.  WIll defer to Dr. Moreen Fowler if he feels referral to pulmonary, or PFTs would be beneficial. "  Since the last visit, he has done well.  Sleep test scheduled on 3/14.  He is back to work 3x/week.  Past Medical History:  Diagnosis Date  . BMI 40.0-44.9, adult ([place])   . Chest pain at rest   . Coronary artery disease, non-occlusive   . Diabetes mellitus   . Hyperlipemia   . Hypertension   . OSA on CPAP   . Peptic ulcer disease   . Recurrent major depressive disorder ([place])   . Rhinitis, allergic   . Routine general medical examination at a health care facility   . Xerosis of skin  Past Surgical History:  Procedure Laterality Date  . LEFT HEART CATH AND CORONARY ANGIOGRAPHY N/A 03/24/2017   Procedure: LEFT HEART CATH AND CORONARY ANGIOGRAPHY;  Surgeon: Jettie Booze, MD;  Location: [place] [facility];  Service: Cardiovascular;  Laterality: N/A;  . RIGHT/LEFT HEART CATH AND CORONARY ANGIOGRAPHY N/A 01/18/2020   Procedure: RIGHT/LEFT HEART CATH AND CORONARY ANGIOGRAPHY;  Surgeon: Jettie Booze, MD;  Location: [place] [facility];  Service: Cardiovascular;  Laterality: N/A;     Current Outpatient Medications  Medication Sig Dispense Refill  . aspirin 81 MG tablet Take 81 mg by mouth daily.    . fluticasone (FLONASE) 50 MCG/ACT nasal spray Place into both nostrils daily.     Marland Kitchen glimepiride (AMARYL) 2 MG tablet Take 2 mg by mouth every morning.    Marland Kitchen lisinopril (ZESTRIL) 10 MG tablet Take 10 mg by mouth daily.    . metFORMIN (GLUCOPHAGE) 1000 MG tablet Take 1 tablet (1,000 mg total) by mouth every other day.    . metoprolol succinate (TOPROL-XL) 25 MG 24 hr tablet Take 25 mg by mouth daily.    . sertraline (ZOLOFT) 100 MG tablet Take 150 mg by mouth at bedtime.    . simvastatin (ZOCOR) 40 MG tablet Take 40 mg by mouth every evening.     No current facility-administered medications for this visit.    Allergies:   Patient has no known allergies.    Social History:  The patient  reports that he has never smoked. He has never used smokeless tobacco. He reports that he does not drink alcohol and does not use drugs.   Family History:  The patient's family history includes Cirrhosis in his father; Diabetes Mellitus I in his mother; Hypertension in his mother; Liver disease in his father.    ROS:  Please see the history of present illness.   Otherwise, review of systems are positive for weight loss since 11/21.   All other systems are reviewed and negative.    PHYSICAL EXAM: VS:  BP (!) 106/54   Pulse 82   Ht 5\' 7"  (1.702 m)   Wt 261 lb 6.4 oz (118.6 kg)   SpO2 94%   BMI 40.94 kg/m  , BMI Body mass index is 40.94 kg/m. GEN: Well nourished, well developed, in no acute distress  HEENT: normal  Neck: no JVD, carotid bruits, or masses Cardiac: RRR; no murmurs, rubs, or gallops,no edema  Respiratory:  clear to auscultation bilaterally, normal work of breathing GI: soft, nontender, nondistended, + BS, obese MS: no deformity or atrophy  Skin: warm and dry, no rash Neuro:  Strength and sensation are intact Psych: euthymic mood, full affect     Recent Labs: 01/05/2020: B Natriuretic Peptide 77.1 01/06/2020: BUN 19; Creatinine, Ser 0.77; Platelets 194 01/18/2020: Hemoglobin 12.2; Potassium 4.0; Sodium 144   Lipid Panel    Component Value Date/Time   CHOL  120 01/04/2020 1124   TRIG 177 (H) 01/04/2020 1124   HDL 21 (L) 01/04/2020 1124   CHOLHDL 5.7 01/04/2020 1124   VLDL 35 01/04/2020 1124   LDLCALC 64 01/04/2020 1124     Other studies Reviewed: Additional studies/ records that were reviewed today with results demonstrating: Jan 2022 labs- A1C 6.3, Cr normal.   ASSESSMENT AND PLAN:  1. Chronic diastolic heart failure: Appears euvolemic.  Low-salt diet.  Blood pressure well controlled.  Continue current medicines.  I spoke about the importance of exercise as well to improve his stamina. 2. HTN: The  current medical regimen is effective;  continue present plan and medications.  Low salt diet. 3. LE edema: resolved.  Leg elevation will be helpful in general. 4. Morbid obesity: He has dropped a few pounds.  He has cut out some snacks.  He has decreased intake of processed foods.    Current medicines are reviewed at length with the patient today.  The patient concerns regarding his medicines were addressed.  The following changes have been made:  No change  Labs/ tests ordered today include:  No orders of the defined types were placed in this encounter.   Recommend 150 minutes/week of aerobic exercise Low fat, low carb, high fiber diet recommended  Disposition:   FU in 1 year   Signed, Larae Grooms, MD  05/23/2020 8:35 AM    [facility] [address], [place], [place]  [address] Phone: [phone]; Fax: [phone]

## 2020-05-23 ENCOUNTER — Other Ambulatory Visit: Payer: Self-pay

## 2020-05-23 ENCOUNTER — Encounter: Payer: Self-pay | Admitting: Interventional Cardiology

## 2020-05-23 ENCOUNTER — Ambulatory Visit: Payer: Medicare HMO | Admitting: Interventional Cardiology

## 2020-05-23 VITALS — BP 106/54 | HR 82 | Ht 67.0 in | Wt 261.4 lb

## 2020-05-23 DIAGNOSIS — I10 Essential (primary) hypertension: Secondary | ICD-10-CM | POA: Diagnosis not present

## 2020-05-23 DIAGNOSIS — I5032 Chronic diastolic (congestive) heart failure: Secondary | ICD-10-CM | POA: Diagnosis not present

## 2020-05-23 DIAGNOSIS — R600 Localized edema: Secondary | ICD-10-CM | POA: Diagnosis not present

## 2020-05-23 NOTE — Patient Instructions (Signed)
Medication Instructions:  Your physician recommends that you continue on your current medications as directed. Please refer to the Current Medication list given to you today.  *If you need a refill on your cardiac medications before your next appointment, please call your pharmacy*   Lab Work: NONE ORDERED  If you have labs (blood work) drawn today and your tests are completely normal, you will receive your results only by: Marland Kitchen MyChart Message (if you have MyChart) OR . A paper copy in the mail If you have any lab test that is abnormal or we need to change your treatment, we will call you to review the results.   Testing/Procedures: NONE ORDERED   Follow-Up: At [facility], you and your health needs are our priority.  As part of our continuing mission to provide you with exceptional heart care, we have created designated Provider Care Teams.  These Care Teams include your primary Cardiologist (physician) and Advanced Practice Providers (APPs -  Physician Assistants and Nurse Practitioners) who all work together to provide you with the care you need, when you need it.  We recommend signing up for the patient portal called "MyChart".  Sign up information is provided on this After Visit Summary.  MyChart is used to connect with patients for Virtual Visits (Telemedicine).  Patients are able to view lab/test results, encounter notes, upcoming appointments, etc.  Non-urgent messages can be sent to your provider as well.   To learn more about what you can do with MyChart, go to [email or web address].    Your next appointment:   1 year(s)  The format for your next appointment:   In Person  Provider:   You may see Larae Grooms, MD or one of the following Advanced Practice Providers on your designated Care Team:    Melina Copa, PA-C  Ermalinda Barrios, PA-C    Other Instructions  High-Fiber Eating Plan Fiber, also called dietary fiber, is a type of carbohydrate. It is found foods  such as fruits, vegetables, whole grains, and beans. A high-fiber diet can have many health benefits. Your health care provider may recommend a high-fiber diet to help:  Prevent constipation. Fiber can make your bowel movements more regular.  Lower your cholesterol.  Relieve the following conditions: ? Inflammation of veins in the anus (hemorrhoids). ? Inflammation of specific areas of the digestive tract (uncomplicated diverticulosis). ? A problem of the large intestine, also called the colon, that sometimes causes pain and diarrhea (irritable bowel syndrome, or IBS).  Prevent overeating as part of a weight-loss plan.  Prevent heart disease, type 2 diabetes, and certain cancers. What are tips for following this plan? Reading food labels  Check the nutrition facts label on food products for the amount of dietary fiber. Choose foods that have 5 grams of fiber or more per serving.  The goals for recommended daily fiber intake include: ? Men (age 75 or younger): 34-38 g. ? Men (over age 75): 28-34 g. ? Women (age 75 or younger): 25-28 g. ? Women (over age 75): 22-25 g. Your daily fiber goal is _____________ g.   Shopping  Choose whole fruits and vegetables instead of processed forms, such as apple juice or applesauce.  Choose a wide variety of high-fiber foods such as avocados, lentils, oats, and kidney beans.  Read the nutrition facts label of the foods you choose. Be aware of foods with added fiber. These foods often have high sugar and sodium amounts per serving. Cooking  Use whole-grain flour for baking  and cooking.  Cook with brown rice instead of white rice. Meal planning  Start the day with a breakfast that is high in fiber, such as a cereal that contains 5 g of fiber or more per serving.  Eat breads and cereals that are made with whole-grain flour instead of refined flour or white flour.  Eat brown rice, bulgur wheat, or millet instead of white rice.  Use beans in  place of meat in soups, salads, and pasta dishes.  Be sure that half of the grains you eat each day are whole grains. General information  You can get the recommended daily intake of dietary fiber by: ? Eating a variety of fruits, vegetables, grains, nuts, and beans. ? Taking a fiber supplement if you are not able to take in enough fiber in your diet. It is better to get fiber through food than from a supplement.  Gradually increase how much fiber you consume. If you increase your intake of dietary fiber too quickly, you may have bloating, cramping, or gas.  Drink plenty of water to help you digest fiber.  Choose high-fiber snacks, such as berries, raw vegetables, nuts, and popcorn. What foods should I eat? Fruits Berries. Pears. Apples. Oranges. Avocado. Prunes and raisins. Dried figs. Vegetables Sweet potatoes. Spinach. Kale. Artichokes. Cabbage. Broccoli. Cauliflower. Green peas. Carrots. Squash. Grains Whole-grain breads. Multigrain cereal. Oats and oatmeal. Brown rice. Barley. Bulgur wheat. [place]. Quinoa. Bran muffins. Popcorn. Rye wafer crackers. Meats and other proteins Navy beans, kidney beans, and pinto beans. Soybeans. Split peas. Lentils. Nuts and seeds. Dairy Fiber-fortified yogurt. Beverages Fiber-fortified soy milk. Fiber-fortified orange juice. Other foods Fiber bars. The items listed above may not be a complete list of recommended foods and beverages. Contact a dietitian for more information. What foods should I avoid? Fruits Fruit juice. Cooked, strained fruit. Vegetables Fried potatoes. Canned vegetables. Well-cooked vegetables. Grains White bread. Pasta made with refined flour. White rice. Meats and other proteins Fatty cuts of meat. Fried chicken or fried fish. Dairy Milk. Yogurt. Cream cheese. Sour cream. Fats and oils Butters. Beverages Soft drinks. Other foods Cakes and pastries. The items listed above may not be a complete list of foods and  beverages to avoid. Talk with your dietitian about what choices are best for you. Summary  Fiber is a type of carbohydrate. It is found in foods such as fruits, vegetables, whole grains, and beans.  A high-fiber diet has many benefits. It can help to prevent constipation, lower blood cholesterol, aid weight loss, and reduce your risk of heart disease, diabetes, and certain cancers.  Increase your intake of fiber gradually. Increasing fiber too quickly may cause cramping, bloating, and gas. Drink plenty of water while you increase the amount of fiber you consume.  The best sources of fiber include whole fruits and vegetables, whole grains, nuts, seeds, and beans. This information is not intended to replace advice given to you by your health care provider. Make sure you discuss any questions you have with your health care provider. Document Revised: 07/26/2019 Document Reviewed: 07/26/2019 Elsevier Patient Education  2021 [employer].

## 2020-05-28 DIAGNOSIS — E782 Mixed hyperlipidemia: Secondary | ICD-10-CM | POA: Diagnosis not present

## 2020-05-28 DIAGNOSIS — I5032 Chronic diastolic (congestive) heart failure: Secondary | ICD-10-CM | POA: Diagnosis not present

## 2020-05-28 DIAGNOSIS — I110 Hypertensive heart disease with heart failure: Secondary | ICD-10-CM | POA: Diagnosis not present

## 2020-05-28 DIAGNOSIS — I10 Essential (primary) hypertension: Secondary | ICD-10-CM | POA: Diagnosis not present

## 2020-05-28 DIAGNOSIS — E1169 Type 2 diabetes mellitus with other specified complication: Secondary | ICD-10-CM | POA: Diagnosis not present

## 2020-05-28 DIAGNOSIS — E1165 Type 2 diabetes mellitus with hyperglycemia: Secondary | ICD-10-CM | POA: Diagnosis not present

## 2020-05-28 DIAGNOSIS — R69 Illness, unspecified: Secondary | ICD-10-CM | POA: Diagnosis not present

## 2020-05-28 DIAGNOSIS — I200 Unstable angina: Secondary | ICD-10-CM | POA: Diagnosis not present

## 2020-06-16 ENCOUNTER — Other Ambulatory Visit: Payer: Self-pay

## 2020-06-16 ENCOUNTER — Ambulatory Visit (HOSPITAL_BASED_OUTPATIENT_CLINIC_OR_DEPARTMENT_OTHER): Payer: Medicare HMO | Attending: Pulmonary Disease | Admitting: Pulmonary Disease

## 2020-06-16 DIAGNOSIS — G4733 Obstructive sleep apnea (adult) (pediatric): Secondary | ICD-10-CM | POA: Insufficient documentation

## 2020-06-17 DIAGNOSIS — G4733 Obstructive sleep apnea (adult) (pediatric): Secondary | ICD-10-CM | POA: Diagnosis not present

## 2020-06-17 NOTE — Procedures (Signed)
    Patient Name: Christopher, Hernandez Date: 06/16/2020 Gender: Male D.O.B: July 03, 1945 Age (years): 74 Referring Provider: Freda Jackson Height (inches): 74 Interpreting Physician: Chesley Mires MD, ABSM Weight (lbs): 260 RPSGT: Zadie Rhine BMI: 38 MRN: [id number] Neck Size: 18.00  CLINICAL INFORMATION The patient is referred for a CPAP titration to treat sleep apnea.  SLEEP STUDY TECHNIQUE As per the AASM Manual for the Scoring of Sleep and Associated Events v2.3 (April 2016) with a hypopnea requiring 4% desaturations.  The channels recorded and monitored were frontal, central and occipital EEG, electrooculogram (EOG), submentalis EMG (chin), nasal and oral airflow, thoracic and abdominal wall motion, anterior tibialis EMG, snore microphone, electrocardiogram, and pulse oximetry. Continuous positive airway pressure (CPAP) was initiated at the beginning of the study and titrated to treat sleep-disordered breathing.  MEDICATIONS Medications self-administered by patient taken the night of the study : N/A  TECHNICIAN COMMENTS Comments added by technician: NO REST ROOM VISTED Comments added by scorer: N/A  RESPIRATORY PARAMETERS Optimal PAP Pressure (cm): 14 AHI at Optimal Pressure (/hr): 0 Overall Minimal O2 (%): 89.0 Supine % at Optimal Pressure (%): 62 Minimal O2 at Optimal Pressure (%): 91.0   SLEEP ARCHITECTURE The study was initiated at 10:09:26 PM and ended at 4:24:41 AM.  Sleep onset time was 14.7 minutes and the sleep efficiency was 85.1%%. The total sleep time was 319.5 minutes.  The patient spent 9.2%% of the night in stage N1 sleep, 74.0%% in stage N2 sleep, 0.0%% in stage N3 and 16.7% in REM.Stage REM latency was 145.5 minutes  Wake after sleep onset was 41.1. Alpha intrusion was absent. Supine sleep was 44.60%.  CARDIAC DATA The 2 lead EKG demonstrated sinus rhythm. The mean heart rate was 60.6 beats per minute. Other EKG findings include: None.  LEG  MOVEMENT DATA The total Periodic Limb Movements of Sleep (PLMS) were 0. The PLMS index was 0.0. A PLMS index of <15 is considered normal in adults.  IMPRESSIONS - The optimal PAP pressure was 14 cm of water. - Did not require use of supplemental oxygen during this study.  DIAGNOSIS - Obstructive Sleep Apnea (G47.33)  RECOMMENDATIONS - Trial of CPAP therapy on 14 cm H2O with a Standard size Resmed Nasal Mask Mirage FX mask and heated humidification. - Avoid alcohol, sedatives and other CNS depressants that may worsen sleep apnea and disrupt normal sleep architecture. - Sleep hygiene should be reviewed to assess factors that may improve sleep quality. - Weight management and regular exercise should be initiated or continued.  [Electronically signed] 06/17/2020 01:57 PM  Chesley Mires MD, [place], American Board of Sleep Medicine   NPI: [id number]

## 2020-06-18 DIAGNOSIS — I200 Unstable angina: Secondary | ICD-10-CM | POA: Diagnosis not present

## 2020-06-18 DIAGNOSIS — I10 Essential (primary) hypertension: Secondary | ICD-10-CM | POA: Diagnosis not present

## 2020-06-18 DIAGNOSIS — I5032 Chronic diastolic (congestive) heart failure: Secondary | ICD-10-CM | POA: Diagnosis not present

## 2020-06-18 DIAGNOSIS — E1169 Type 2 diabetes mellitus with other specified complication: Secondary | ICD-10-CM | POA: Diagnosis not present

## 2020-06-18 DIAGNOSIS — E782 Mixed hyperlipidemia: Secondary | ICD-10-CM | POA: Diagnosis not present

## 2020-06-18 DIAGNOSIS — R69 Illness, unspecified: Secondary | ICD-10-CM | POA: Diagnosis not present

## 2020-06-18 DIAGNOSIS — E1165 Type 2 diabetes mellitus with hyperglycemia: Secondary | ICD-10-CM | POA: Diagnosis not present

## 2020-06-18 DIAGNOSIS — I110 Hypertensive heart disease with heart failure: Secondary | ICD-10-CM | POA: Diagnosis not present

## 2020-06-27 ENCOUNTER — Telehealth: Payer: Self-pay | Admitting: Pulmonary Disease

## 2020-06-27 DIAGNOSIS — G4733 Obstructive sleep apnea (adult) (pediatric): Secondary | ICD-10-CM

## 2020-06-27 NOTE — Telephone Encounter (Signed)
Called and spoke with pt and he stated that he uses [facility].  The order has been placed for his DME.

## 2020-06-27 NOTE — Telephone Encounter (Signed)
I called the patient and left a VM about his sleep study results. I let him know that we will be sending orders to his DME company for the below changes to his cpap/mask.   Please send these orders to his DME: - Trial of CPAP therapy on 14 cm H2O with a Standard size Resmed Nasal Mask Mirage FX mask and heated humidification.  Thanks, Wille Glaser

## 2020-07-04 DIAGNOSIS — H5203 Hypermetropia, bilateral: Secondary | ICD-10-CM | POA: Diagnosis not present

## 2020-07-04 DIAGNOSIS — G4730 Sleep apnea, unspecified: Secondary | ICD-10-CM | POA: Diagnosis not present

## 2020-07-04 DIAGNOSIS — I5032 Chronic diastolic (congestive) heart failure: Secondary | ICD-10-CM | POA: Diagnosis not present

## 2020-07-04 DIAGNOSIS — R5381 Other malaise: Secondary | ICD-10-CM | POA: Diagnosis not present

## 2020-07-28 DIAGNOSIS — H2513 Age-related nuclear cataract, bilateral: Secondary | ICD-10-CM | POA: Diagnosis not present

## 2020-07-28 DIAGNOSIS — E119 Type 2 diabetes mellitus without complications: Secondary | ICD-10-CM | POA: Diagnosis not present

## 2020-07-30 DIAGNOSIS — E1165 Type 2 diabetes mellitus with hyperglycemia: Secondary | ICD-10-CM | POA: Diagnosis not present

## 2020-07-30 DIAGNOSIS — I5032 Chronic diastolic (congestive) heart failure: Secondary | ICD-10-CM | POA: Diagnosis not present

## 2020-07-30 DIAGNOSIS — I10 Essential (primary) hypertension: Secondary | ICD-10-CM | POA: Diagnosis not present

## 2020-07-30 DIAGNOSIS — R69 Illness, unspecified: Secondary | ICD-10-CM | POA: Diagnosis not present

## 2020-07-30 DIAGNOSIS — E1169 Type 2 diabetes mellitus with other specified complication: Secondary | ICD-10-CM | POA: Diagnosis not present

## 2020-07-30 DIAGNOSIS — E782 Mixed hyperlipidemia: Secondary | ICD-10-CM | POA: Diagnosis not present

## 2020-07-30 DIAGNOSIS — I200 Unstable angina: Secondary | ICD-10-CM | POA: Diagnosis not present

## 2020-08-25 DIAGNOSIS — L738 Other specified follicular disorders: Secondary | ICD-10-CM | POA: Diagnosis not present

## 2020-08-25 DIAGNOSIS — L814 Other melanin hyperpigmentation: Secondary | ICD-10-CM | POA: Diagnosis not present

## 2020-08-25 DIAGNOSIS — Z85828 Personal history of other malignant neoplasm of skin: Secondary | ICD-10-CM | POA: Diagnosis not present

## 2020-08-25 DIAGNOSIS — D225 Melanocytic nevi of trunk: Secondary | ICD-10-CM | POA: Diagnosis not present

## 2020-08-25 DIAGNOSIS — L821 Other seborrheic keratosis: Secondary | ICD-10-CM | POA: Diagnosis not present

## 2020-08-25 DIAGNOSIS — D1801 Hemangioma of skin and subcutaneous tissue: Secondary | ICD-10-CM | POA: Diagnosis not present

## 2020-08-25 DIAGNOSIS — L570 Actinic keratosis: Secondary | ICD-10-CM | POA: Diagnosis not present

## 2020-09-02 DIAGNOSIS — I10 Essential (primary) hypertension: Secondary | ICD-10-CM | POA: Diagnosis not present

## 2020-09-02 DIAGNOSIS — R69 Illness, unspecified: Secondary | ICD-10-CM | POA: Diagnosis not present

## 2020-09-02 DIAGNOSIS — I200 Unstable angina: Secondary | ICD-10-CM | POA: Diagnosis not present

## 2020-09-02 DIAGNOSIS — I110 Hypertensive heart disease with heart failure: Secondary | ICD-10-CM | POA: Diagnosis not present

## 2020-09-02 DIAGNOSIS — E1165 Type 2 diabetes mellitus with hyperglycemia: Secondary | ICD-10-CM | POA: Diagnosis not present

## 2020-09-02 DIAGNOSIS — I5032 Chronic diastolic (congestive) heart failure: Secondary | ICD-10-CM | POA: Diagnosis not present

## 2020-09-02 DIAGNOSIS — E782 Mixed hyperlipidemia: Secondary | ICD-10-CM | POA: Diagnosis not present

## 2020-09-02 DIAGNOSIS — E1169 Type 2 diabetes mellitus with other specified complication: Secondary | ICD-10-CM | POA: Diagnosis not present

## 2020-09-05 DIAGNOSIS — J309 Allergic rhinitis, unspecified: Secondary | ICD-10-CM | POA: Diagnosis not present

## 2020-09-05 DIAGNOSIS — R946 Abnormal results of thyroid function studies: Secondary | ICD-10-CM | POA: Diagnosis not present

## 2020-09-05 DIAGNOSIS — I110 Hypertensive heart disease with heart failure: Secondary | ICD-10-CM | POA: Diagnosis not present

## 2020-09-05 DIAGNOSIS — E1169 Type 2 diabetes mellitus with other specified complication: Secondary | ICD-10-CM | POA: Diagnosis not present

## 2020-09-05 DIAGNOSIS — R69 Illness, unspecified: Secondary | ICD-10-CM | POA: Diagnosis not present

## 2020-09-05 DIAGNOSIS — G4730 Sleep apnea, unspecified: Secondary | ICD-10-CM | POA: Diagnosis not present

## 2020-09-05 DIAGNOSIS — Z8616 Personal history of COVID-19: Secondary | ICD-10-CM | POA: Diagnosis not present

## 2020-09-05 DIAGNOSIS — E782 Mixed hyperlipidemia: Secondary | ICD-10-CM | POA: Diagnosis not present

## 2020-09-05 DIAGNOSIS — I5032 Chronic diastolic (congestive) heart failure: Secondary | ICD-10-CM | POA: Diagnosis not present

## 2020-09-12 DIAGNOSIS — I200 Unstable angina: Secondary | ICD-10-CM | POA: Diagnosis not present

## 2020-09-12 DIAGNOSIS — R69 Illness, unspecified: Secondary | ICD-10-CM | POA: Diagnosis not present

## 2020-09-12 DIAGNOSIS — E1169 Type 2 diabetes mellitus with other specified complication: Secondary | ICD-10-CM | POA: Diagnosis not present

## 2020-09-12 DIAGNOSIS — I5032 Chronic diastolic (congestive) heart failure: Secondary | ICD-10-CM | POA: Diagnosis not present

## 2020-09-12 DIAGNOSIS — E1165 Type 2 diabetes mellitus with hyperglycemia: Secondary | ICD-10-CM | POA: Diagnosis not present

## 2020-09-12 DIAGNOSIS — E782 Mixed hyperlipidemia: Secondary | ICD-10-CM | POA: Diagnosis not present

## 2020-09-12 DIAGNOSIS — I10 Essential (primary) hypertension: Secondary | ICD-10-CM | POA: Diagnosis not present

## 2020-10-09 DIAGNOSIS — G4733 Obstructive sleep apnea (adult) (pediatric): Secondary | ICD-10-CM | POA: Diagnosis not present

## 2020-10-10 ENCOUNTER — Telehealth: Payer: Self-pay | Admitting: Pulmonary Disease

## 2020-10-10 NOTE — Telephone Encounter (Signed)
Received a fax from [facility] stating that the patient received his cpap machine on 10/09/20. Per [employer], he will need a compliance follow up in September/October 2022.   Would you like for him to follow up with you for his cpap or establish with one of the sleep doctors? I believe Dr. Halford Chessman was the one who read his sleep study.

## 2020-10-12 DIAGNOSIS — I200 Unstable angina: Secondary | ICD-10-CM | POA: Diagnosis not present

## 2020-10-12 DIAGNOSIS — E782 Mixed hyperlipidemia: Secondary | ICD-10-CM | POA: Diagnosis not present

## 2020-10-12 DIAGNOSIS — E1169 Type 2 diabetes mellitus with other specified complication: Secondary | ICD-10-CM | POA: Diagnosis not present

## 2020-10-12 DIAGNOSIS — I10 Essential (primary) hypertension: Secondary | ICD-10-CM | POA: Diagnosis not present

## 2020-10-12 DIAGNOSIS — E1165 Type 2 diabetes mellitus with hyperglycemia: Secondary | ICD-10-CM | POA: Diagnosis not present

## 2020-10-12 DIAGNOSIS — R69 Illness, unspecified: Secondary | ICD-10-CM | POA: Diagnosis not present

## 2020-10-12 DIAGNOSIS — I110 Hypertensive heart disease with heart failure: Secondary | ICD-10-CM | POA: Diagnosis not present

## 2020-10-12 DIAGNOSIS — I5032 Chronic diastolic (congestive) heart failure: Secondary | ICD-10-CM | POA: Diagnosis not present

## 2020-10-17 NOTE — Telephone Encounter (Signed)
Called patient, a male answered the phone and stated that he was not available. Will call back next week.

## 2020-11-09 DIAGNOSIS — G4733 Obstructive sleep apnea (adult) (pediatric): Secondary | ICD-10-CM | POA: Diagnosis not present

## 2020-12-02 DIAGNOSIS — I10 Essential (primary) hypertension: Secondary | ICD-10-CM | POA: Diagnosis not present

## 2020-12-02 DIAGNOSIS — I5032 Chronic diastolic (congestive) heart failure: Secondary | ICD-10-CM | POA: Diagnosis not present

## 2020-12-02 DIAGNOSIS — I200 Unstable angina: Secondary | ICD-10-CM | POA: Diagnosis not present

## 2020-12-02 DIAGNOSIS — E782 Mixed hyperlipidemia: Secondary | ICD-10-CM | POA: Diagnosis not present

## 2020-12-02 DIAGNOSIS — I110 Hypertensive heart disease with heart failure: Secondary | ICD-10-CM | POA: Diagnosis not present

## 2020-12-02 DIAGNOSIS — E1165 Type 2 diabetes mellitus with hyperglycemia: Secondary | ICD-10-CM | POA: Diagnosis not present

## 2020-12-02 DIAGNOSIS — E1169 Type 2 diabetes mellitus with other specified complication: Secondary | ICD-10-CM | POA: Diagnosis not present

## 2020-12-02 DIAGNOSIS — R69 Illness, unspecified: Secondary | ICD-10-CM | POA: Diagnosis not present

## 2020-12-10 DIAGNOSIS — I110 Hypertensive heart disease with heart failure: Secondary | ICD-10-CM | POA: Diagnosis not present

## 2020-12-10 DIAGNOSIS — E782 Mixed hyperlipidemia: Secondary | ICD-10-CM | POA: Diagnosis not present

## 2020-12-10 DIAGNOSIS — R69 Illness, unspecified: Secondary | ICD-10-CM | POA: Diagnosis not present

## 2020-12-10 DIAGNOSIS — I10 Essential (primary) hypertension: Secondary | ICD-10-CM | POA: Diagnosis not present

## 2020-12-10 DIAGNOSIS — E1165 Type 2 diabetes mellitus with hyperglycemia: Secondary | ICD-10-CM | POA: Diagnosis not present

## 2020-12-10 DIAGNOSIS — I5032 Chronic diastolic (congestive) heart failure: Secondary | ICD-10-CM | POA: Diagnosis not present

## 2020-12-10 DIAGNOSIS — E1169 Type 2 diabetes mellitus with other specified complication: Secondary | ICD-10-CM | POA: Diagnosis not present

## 2020-12-10 DIAGNOSIS — I200 Unstable angina: Secondary | ICD-10-CM | POA: Diagnosis not present

## 2020-12-10 DIAGNOSIS — G4733 Obstructive sleep apnea (adult) (pediatric): Secondary | ICD-10-CM | POA: Diagnosis not present

## 2021-01-09 DIAGNOSIS — G4733 Obstructive sleep apnea (adult) (pediatric): Secondary | ICD-10-CM | POA: Diagnosis not present

## 2021-01-12 DIAGNOSIS — R69 Illness, unspecified: Secondary | ICD-10-CM | POA: Diagnosis not present

## 2021-01-12 DIAGNOSIS — E782 Mixed hyperlipidemia: Secondary | ICD-10-CM | POA: Diagnosis not present

## 2021-01-12 DIAGNOSIS — I5032 Chronic diastolic (congestive) heart failure: Secondary | ICD-10-CM | POA: Diagnosis not present

## 2021-01-12 DIAGNOSIS — I200 Unstable angina: Secondary | ICD-10-CM | POA: Diagnosis not present

## 2021-01-12 DIAGNOSIS — I10 Essential (primary) hypertension: Secondary | ICD-10-CM | POA: Diagnosis not present

## 2021-01-12 DIAGNOSIS — I110 Hypertensive heart disease with heart failure: Secondary | ICD-10-CM | POA: Diagnosis not present

## 2021-01-12 DIAGNOSIS — E1165 Type 2 diabetes mellitus with hyperglycemia: Secondary | ICD-10-CM | POA: Diagnosis not present

## 2021-01-12 DIAGNOSIS — E1169 Type 2 diabetes mellitus with other specified complication: Secondary | ICD-10-CM | POA: Diagnosis not present

## 2021-02-09 DIAGNOSIS — G4733 Obstructive sleep apnea (adult) (pediatric): Secondary | ICD-10-CM | POA: Diagnosis not present

## 2021-02-25 DIAGNOSIS — I5032 Chronic diastolic (congestive) heart failure: Secondary | ICD-10-CM | POA: Diagnosis not present

## 2021-02-25 DIAGNOSIS — R69 Illness, unspecified: Secondary | ICD-10-CM | POA: Diagnosis not present

## 2021-02-25 DIAGNOSIS — E1165 Type 2 diabetes mellitus with hyperglycemia: Secondary | ICD-10-CM | POA: Diagnosis not present

## 2021-02-25 DIAGNOSIS — E782 Mixed hyperlipidemia: Secondary | ICD-10-CM | POA: Diagnosis not present

## 2021-02-25 DIAGNOSIS — I110 Hypertensive heart disease with heart failure: Secondary | ICD-10-CM | POA: Diagnosis not present

## 2021-02-25 DIAGNOSIS — E1169 Type 2 diabetes mellitus with other specified complication: Secondary | ICD-10-CM | POA: Diagnosis not present

## 2021-02-25 DIAGNOSIS — I10 Essential (primary) hypertension: Secondary | ICD-10-CM | POA: Diagnosis not present

## 2021-03-02 DIAGNOSIS — G4733 Obstructive sleep apnea (adult) (pediatric): Secondary | ICD-10-CM | POA: Diagnosis not present

## 2021-03-10 DIAGNOSIS — E782 Mixed hyperlipidemia: Secondary | ICD-10-CM | POA: Diagnosis not present

## 2021-03-10 DIAGNOSIS — I110 Hypertensive heart disease with heart failure: Secondary | ICD-10-CM | POA: Diagnosis not present

## 2021-03-10 DIAGNOSIS — I10 Essential (primary) hypertension: Secondary | ICD-10-CM | POA: Diagnosis not present

## 2021-03-10 DIAGNOSIS — E1169 Type 2 diabetes mellitus with other specified complication: Secondary | ICD-10-CM | POA: Diagnosis not present

## 2021-03-10 DIAGNOSIS — E1165 Type 2 diabetes mellitus with hyperglycemia: Secondary | ICD-10-CM | POA: Diagnosis not present

## 2021-03-10 DIAGNOSIS — I5032 Chronic diastolic (congestive) heart failure: Secondary | ICD-10-CM | POA: Diagnosis not present

## 2021-03-10 DIAGNOSIS — R69 Illness, unspecified: Secondary | ICD-10-CM | POA: Diagnosis not present

## 2021-03-11 DIAGNOSIS — G4733 Obstructive sleep apnea (adult) (pediatric): Secondary | ICD-10-CM | POA: Diagnosis not present

## 2021-04-11 DIAGNOSIS — G4733 Obstructive sleep apnea (adult) (pediatric): Secondary | ICD-10-CM | POA: Diagnosis not present

## 2021-04-24 DIAGNOSIS — I5032 Chronic diastolic (congestive) heart failure: Secondary | ICD-10-CM | POA: Diagnosis not present

## 2021-04-24 DIAGNOSIS — Z1159 Encounter for screening for other viral diseases: Secondary | ICD-10-CM | POA: Diagnosis not present

## 2021-04-24 DIAGNOSIS — I110 Hypertensive heart disease with heart failure: Secondary | ICD-10-CM | POA: Diagnosis not present

## 2021-04-24 DIAGNOSIS — Z0000 Encounter for general adult medical examination without abnormal findings: Secondary | ICD-10-CM | POA: Diagnosis not present

## 2021-04-24 DIAGNOSIS — E782 Mixed hyperlipidemia: Secondary | ICD-10-CM | POA: Diagnosis not present

## 2021-04-24 DIAGNOSIS — E1169 Type 2 diabetes mellitus with other specified complication: Secondary | ICD-10-CM | POA: Diagnosis not present

## 2021-04-24 DIAGNOSIS — Z1331 Encounter for screening for depression: Secondary | ICD-10-CM | POA: Diagnosis not present

## 2021-04-24 DIAGNOSIS — Z23 Encounter for immunization: Secondary | ICD-10-CM | POA: Diagnosis not present

## 2021-04-24 DIAGNOSIS — Z1389 Encounter for screening for other disorder: Secondary | ICD-10-CM | POA: Diagnosis not present

## 2021-04-24 DIAGNOSIS — M79662 Pain in left lower leg: Secondary | ICD-10-CM | POA: Diagnosis not present

## 2021-04-24 DIAGNOSIS — Z6841 Body Mass Index (BMI) 40.0 and over, adult: Secondary | ICD-10-CM | POA: Diagnosis not present

## 2021-04-24 DIAGNOSIS — Z8616 Personal history of COVID-19: Secondary | ICD-10-CM | POA: Diagnosis not present

## 2021-05-05 DIAGNOSIS — I110 Hypertensive heart disease with heart failure: Secondary | ICD-10-CM | POA: Diagnosis not present

## 2021-05-05 DIAGNOSIS — I5032 Chronic diastolic (congestive) heart failure: Secondary | ICD-10-CM | POA: Diagnosis not present

## 2021-05-05 DIAGNOSIS — I200 Unstable angina: Secondary | ICD-10-CM | POA: Diagnosis not present

## 2021-05-05 DIAGNOSIS — I10 Essential (primary) hypertension: Secondary | ICD-10-CM | POA: Diagnosis not present

## 2021-05-05 DIAGNOSIS — R69 Illness, unspecified: Secondary | ICD-10-CM | POA: Diagnosis not present

## 2021-05-05 DIAGNOSIS — E1165 Type 2 diabetes mellitus with hyperglycemia: Secondary | ICD-10-CM | POA: Diagnosis not present

## 2021-05-05 DIAGNOSIS — E1169 Type 2 diabetes mellitus with other specified complication: Secondary | ICD-10-CM | POA: Diagnosis not present

## 2021-05-05 DIAGNOSIS — E782 Mixed hyperlipidemia: Secondary | ICD-10-CM | POA: Diagnosis not present

## 2021-05-12 DIAGNOSIS — G4733 Obstructive sleep apnea (adult) (pediatric): Secondary | ICD-10-CM | POA: Diagnosis not present

## 2021-06-08 DIAGNOSIS — E1165 Type 2 diabetes mellitus with hyperglycemia: Secondary | ICD-10-CM | POA: Diagnosis not present

## 2021-06-08 DIAGNOSIS — E782 Mixed hyperlipidemia: Secondary | ICD-10-CM | POA: Diagnosis not present

## 2021-06-08 DIAGNOSIS — I110 Hypertensive heart disease with heart failure: Secondary | ICD-10-CM | POA: Diagnosis not present

## 2021-06-08 DIAGNOSIS — I5032 Chronic diastolic (congestive) heart failure: Secondary | ICD-10-CM | POA: Diagnosis not present

## 2021-06-08 DIAGNOSIS — R69 Illness, unspecified: Secondary | ICD-10-CM | POA: Diagnosis not present

## 2021-06-09 DIAGNOSIS — G4733 Obstructive sleep apnea (adult) (pediatric): Secondary | ICD-10-CM | POA: Diagnosis not present

## 2021-07-10 DIAGNOSIS — G4733 Obstructive sleep apnea (adult) (pediatric): Secondary | ICD-10-CM | POA: Diagnosis not present

## 2021-09-01 DIAGNOSIS — I10 Essential (primary) hypertension: Secondary | ICD-10-CM | POA: Diagnosis not present

## 2021-09-01 DIAGNOSIS — E1165 Type 2 diabetes mellitus with hyperglycemia: Secondary | ICD-10-CM | POA: Diagnosis not present

## 2021-09-01 DIAGNOSIS — I5032 Chronic diastolic (congestive) heart failure: Secondary | ICD-10-CM | POA: Diagnosis not present

## 2021-09-01 DIAGNOSIS — E782 Mixed hyperlipidemia: Secondary | ICD-10-CM | POA: Diagnosis not present

## 2021-09-06 DIAGNOSIS — M25461 Effusion, right knee: Secondary | ICD-10-CM | POA: Diagnosis not present

## 2021-09-18 DIAGNOSIS — M25561 Pain in right knee: Secondary | ICD-10-CM | POA: Diagnosis not present

## 2021-10-01 DIAGNOSIS — E782 Mixed hyperlipidemia: Secondary | ICD-10-CM | POA: Diagnosis not present

## 2021-10-01 DIAGNOSIS — E1165 Type 2 diabetes mellitus with hyperglycemia: Secondary | ICD-10-CM | POA: Diagnosis not present

## 2021-10-01 DIAGNOSIS — I5032 Chronic diastolic (congestive) heart failure: Secondary | ICD-10-CM | POA: Diagnosis not present

## 2021-10-01 DIAGNOSIS — I10 Essential (primary) hypertension: Secondary | ICD-10-CM | POA: Diagnosis not present

## 2021-11-27 DIAGNOSIS — G4730 Sleep apnea, unspecified: Secondary | ICD-10-CM | POA: Diagnosis not present

## 2021-11-27 DIAGNOSIS — E782 Mixed hyperlipidemia: Secondary | ICD-10-CM | POA: Diagnosis not present

## 2021-11-27 DIAGNOSIS — R69 Illness, unspecified: Secondary | ICD-10-CM | POA: Diagnosis not present

## 2021-11-27 DIAGNOSIS — E1169 Type 2 diabetes mellitus with other specified complication: Secondary | ICD-10-CM | POA: Diagnosis not present

## 2021-11-27 DIAGNOSIS — M25561 Pain in right knee: Secondary | ICD-10-CM | POA: Diagnosis not present

## 2021-11-27 DIAGNOSIS — Z7984 Long term (current) use of oral hypoglycemic drugs: Secondary | ICD-10-CM | POA: Diagnosis not present

## 2021-11-27 DIAGNOSIS — I5032 Chronic diastolic (congestive) heart failure: Secondary | ICD-10-CM | POA: Diagnosis not present

## 2021-11-27 DIAGNOSIS — J309 Allergic rhinitis, unspecified: Secondary | ICD-10-CM | POA: Diagnosis not present

## 2021-11-27 DIAGNOSIS — Z6841 Body Mass Index (BMI) 40.0 and over, adult: Secondary | ICD-10-CM | POA: Diagnosis not present

## 2021-11-27 DIAGNOSIS — I110 Hypertensive heart disease with heart failure: Secondary | ICD-10-CM | POA: Diagnosis not present

## 2021-11-28 DIAGNOSIS — M25461 Effusion, right knee: Secondary | ICD-10-CM | POA: Diagnosis not present

## 2021-12-31 DIAGNOSIS — I10 Essential (primary) hypertension: Secondary | ICD-10-CM | POA: Diagnosis not present

## 2021-12-31 DIAGNOSIS — E782 Mixed hyperlipidemia: Secondary | ICD-10-CM | POA: Diagnosis not present

## 2021-12-31 DIAGNOSIS — E1165 Type 2 diabetes mellitus with hyperglycemia: Secondary | ICD-10-CM | POA: Diagnosis not present

## 2021-12-31 DIAGNOSIS — I5032 Chronic diastolic (congestive) heart failure: Secondary | ICD-10-CM | POA: Diagnosis not present

## 2022-02-02 DIAGNOSIS — I10 Essential (primary) hypertension: Secondary | ICD-10-CM | POA: Diagnosis not present

## 2022-02-02 DIAGNOSIS — I5032 Chronic diastolic (congestive) heart failure: Secondary | ICD-10-CM | POA: Diagnosis not present

## 2022-02-02 DIAGNOSIS — E782 Mixed hyperlipidemia: Secondary | ICD-10-CM | POA: Diagnosis not present

## 2022-02-02 DIAGNOSIS — E1165 Type 2 diabetes mellitus with hyperglycemia: Secondary | ICD-10-CM | POA: Diagnosis not present

## 2022-03-03 DIAGNOSIS — E1165 Type 2 diabetes mellitus with hyperglycemia: Secondary | ICD-10-CM | POA: Diagnosis not present

## 2022-03-03 DIAGNOSIS — I10 Essential (primary) hypertension: Secondary | ICD-10-CM | POA: Diagnosis not present

## 2022-03-03 DIAGNOSIS — I5032 Chronic diastolic (congestive) heart failure: Secondary | ICD-10-CM | POA: Diagnosis not present

## 2022-03-03 DIAGNOSIS — E782 Mixed hyperlipidemia: Secondary | ICD-10-CM | POA: Diagnosis not present

## 2022-04-26 DIAGNOSIS — G4733 Obstructive sleep apnea (adult) (pediatric): Secondary | ICD-10-CM | POA: Diagnosis not present

## 2022-04-26 DIAGNOSIS — I10 Essential (primary) hypertension: Secondary | ICD-10-CM | POA: Diagnosis not present

## 2022-05-24 DIAGNOSIS — E1169 Type 2 diabetes mellitus with other specified complication: Secondary | ICD-10-CM | POA: Diagnosis not present

## 2022-05-24 DIAGNOSIS — Z1331 Encounter for screening for depression: Secondary | ICD-10-CM | POA: Diagnosis not present

## 2022-05-24 DIAGNOSIS — R69 Illness, unspecified: Secondary | ICD-10-CM | POA: Diagnosis not present

## 2022-05-24 DIAGNOSIS — M25561 Pain in right knee: Secondary | ICD-10-CM | POA: Diagnosis not present

## 2022-05-24 DIAGNOSIS — Z9989 Dependence on other enabling machines and devices: Secondary | ICD-10-CM | POA: Diagnosis not present

## 2022-05-24 DIAGNOSIS — E782 Mixed hyperlipidemia: Secondary | ICD-10-CM | POA: Diagnosis not present

## 2022-05-24 DIAGNOSIS — Z6841 Body Mass Index (BMI) 40.0 and over, adult: Secondary | ICD-10-CM | POA: Diagnosis not present

## 2022-05-24 DIAGNOSIS — I110 Hypertensive heart disease with heart failure: Secondary | ICD-10-CM | POA: Diagnosis not present

## 2022-05-24 DIAGNOSIS — I5032 Chronic diastolic (congestive) heart failure: Secondary | ICD-10-CM | POA: Diagnosis not present

## 2022-05-24 DIAGNOSIS — E1165 Type 2 diabetes mellitus with hyperglycemia: Secondary | ICD-10-CM | POA: Diagnosis not present

## 2022-05-24 DIAGNOSIS — Z23 Encounter for immunization: Secondary | ICD-10-CM | POA: Diagnosis not present

## 2022-05-24 DIAGNOSIS — F339 Major depressive disorder, recurrent, unspecified: Secondary | ICD-10-CM | POA: Diagnosis not present

## 2022-05-24 DIAGNOSIS — Z0000 Encounter for general adult medical examination without abnormal findings: Secondary | ICD-10-CM | POA: Diagnosis not present

## 2022-08-01 IMAGING — CR DG CHEST 2V
2 series · 2 of 2 positions shown · non-contrast
Comparison: None.

CLINICAL DATA: Chest pain

EXAM:
CHEST - 2 VIEW

[chest pa]
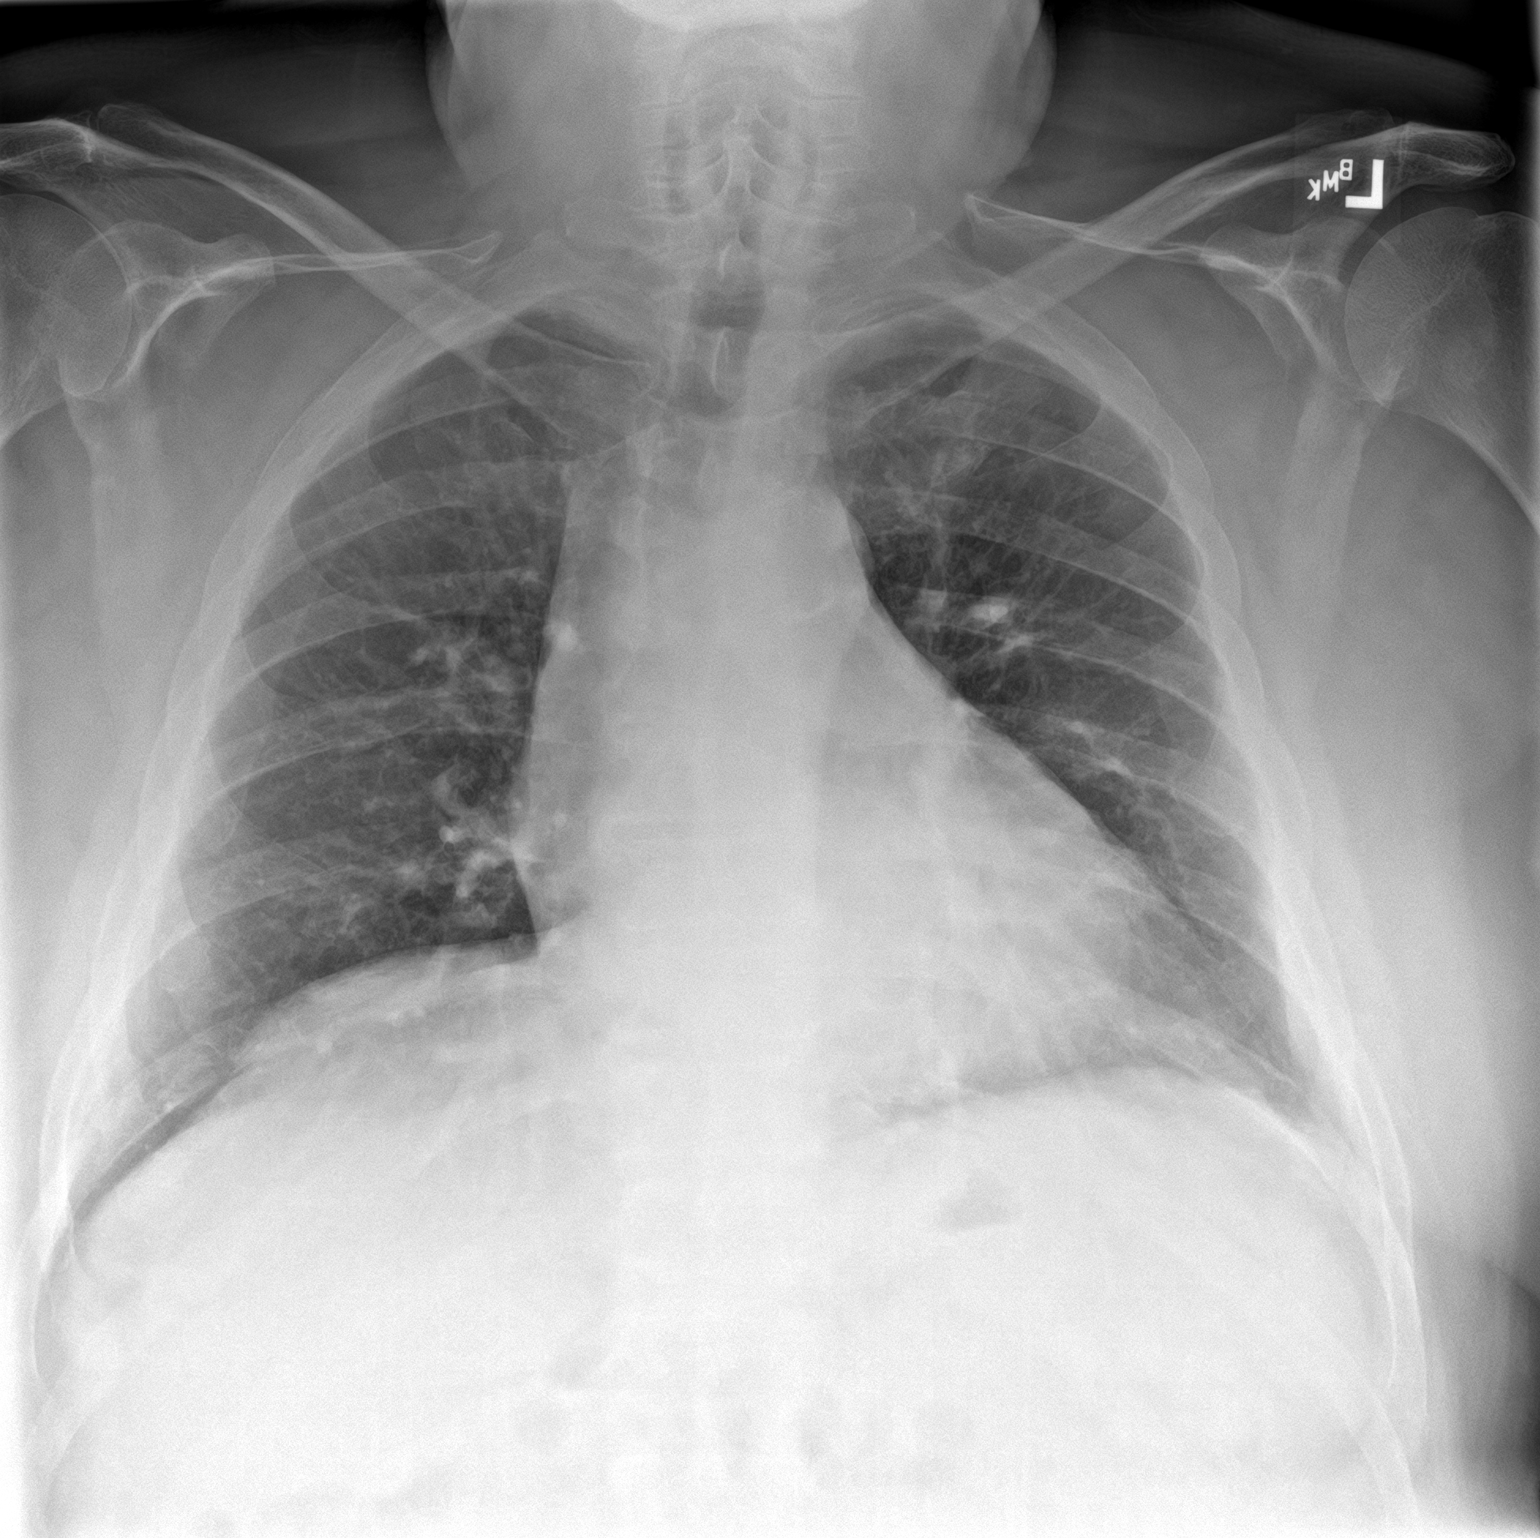

[chest lat]
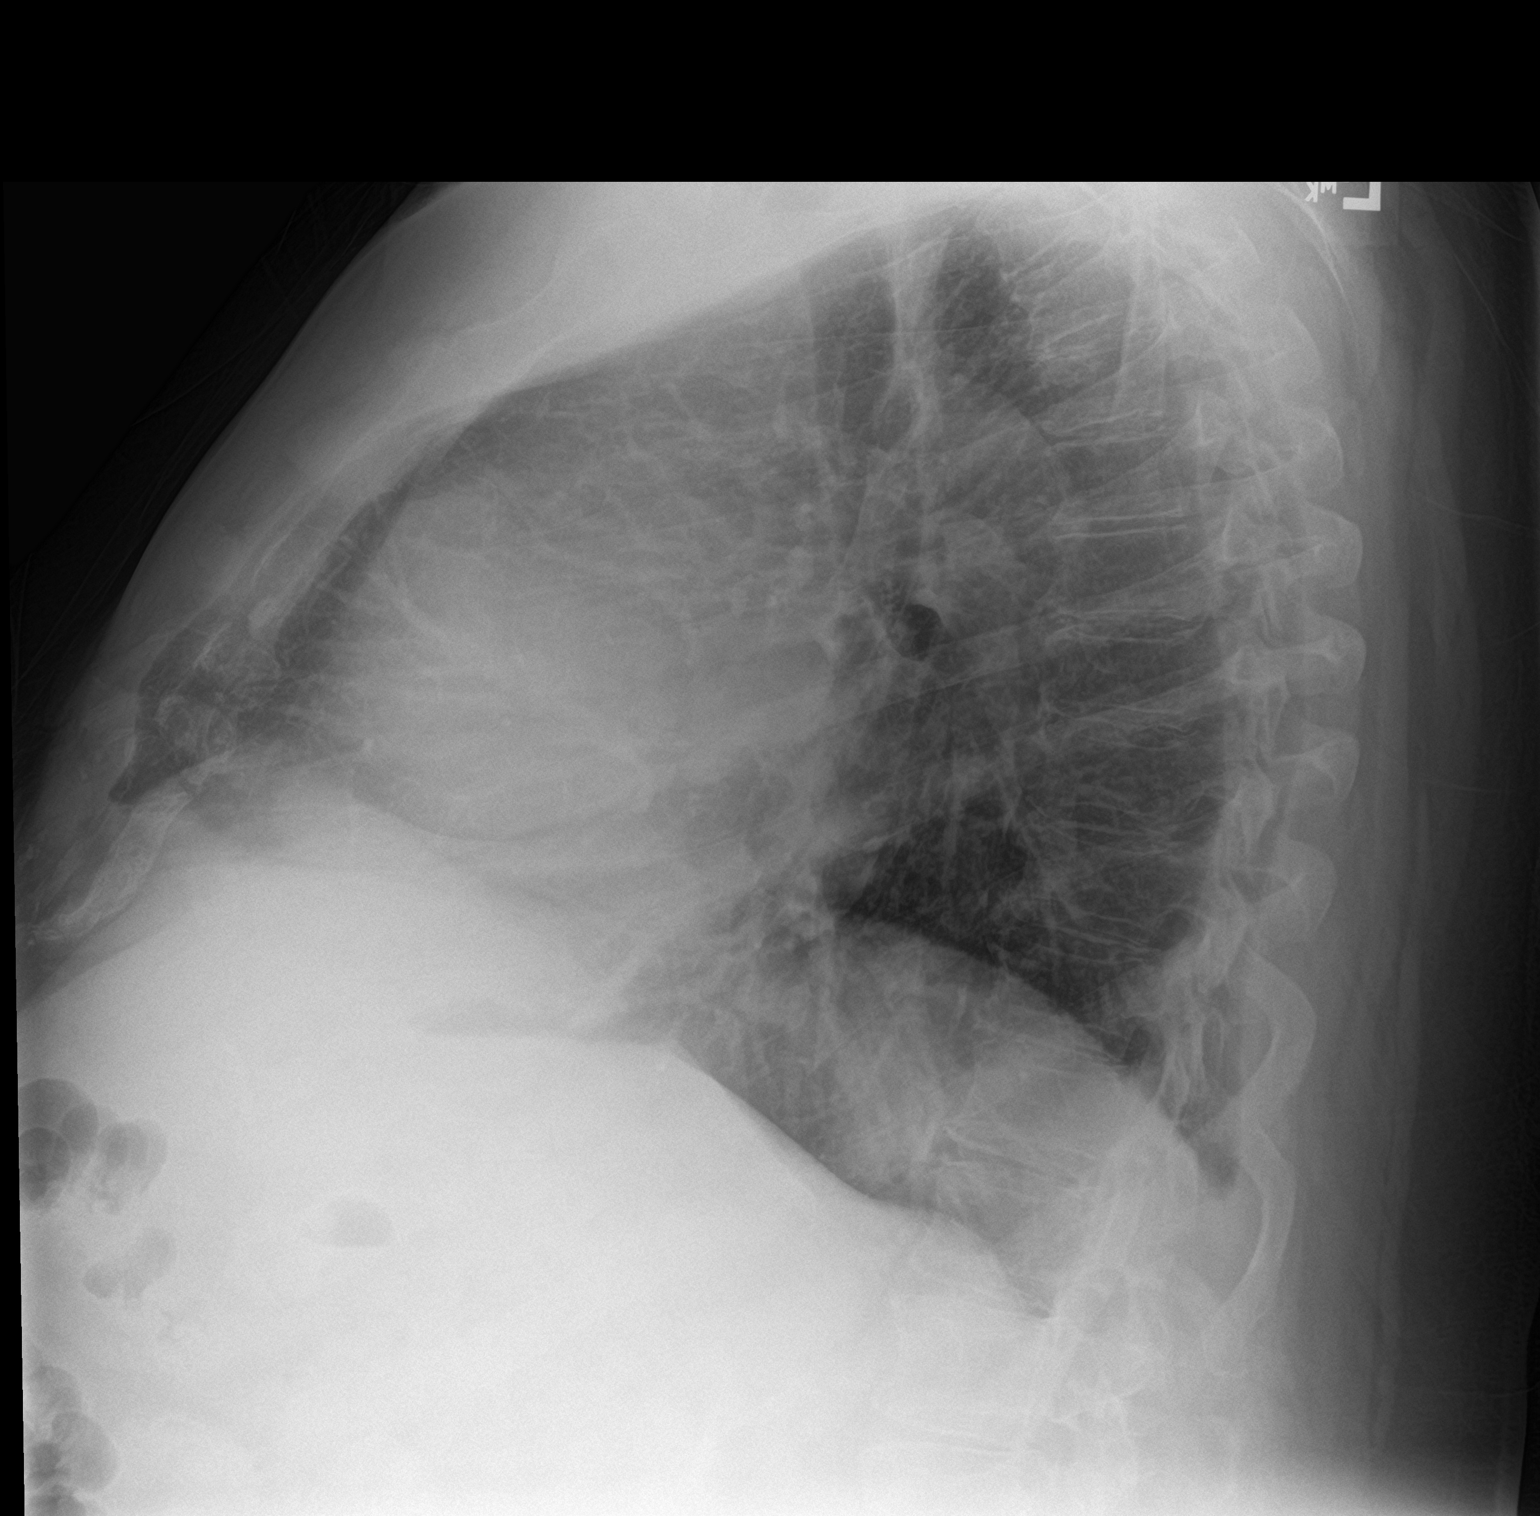

[2 of 2 positions shown; findings below may reference images not displayed]

FINDINGS: The heart size and mediastinal contours are within normal limits.
Both lungs are clear. The visualized skeletal structures are
unremarkable.
IMPRESSION: No active cardiopulmonary disease.

## 2022-10-05 DIAGNOSIS — J309 Allergic rhinitis, unspecified: Secondary | ICD-10-CM | POA: Diagnosis not present

## 2022-10-05 DIAGNOSIS — H6062 Unspecified chronic otitis externa, left ear: Secondary | ICD-10-CM | POA: Diagnosis not present

## 2022-11-26 DIAGNOSIS — I110 Hypertensive heart disease with heart failure: Secondary | ICD-10-CM | POA: Diagnosis not present

## 2022-11-26 DIAGNOSIS — E119 Type 2 diabetes mellitus without complications: Secondary | ICD-10-CM | POA: Diagnosis not present

## 2022-11-26 DIAGNOSIS — Z6841 Body Mass Index (BMI) 40.0 and over, adult: Secondary | ICD-10-CM | POA: Diagnosis not present

## 2022-11-26 DIAGNOSIS — F339 Major depressive disorder, recurrent, unspecified: Secondary | ICD-10-CM | POA: Diagnosis not present

## 2022-11-26 DIAGNOSIS — L02222 Furuncle of back [any part, except buttock]: Secondary | ICD-10-CM | POA: Diagnosis not present

## 2022-11-26 DIAGNOSIS — G4730 Sleep apnea, unspecified: Secondary | ICD-10-CM | POA: Diagnosis not present

## 2022-11-26 DIAGNOSIS — J309 Allergic rhinitis, unspecified: Secondary | ICD-10-CM | POA: Diagnosis not present

## 2022-11-26 DIAGNOSIS — I5032 Chronic diastolic (congestive) heart failure: Secondary | ICD-10-CM | POA: Diagnosis not present

## 2022-11-26 DIAGNOSIS — E782 Mixed hyperlipidemia: Secondary | ICD-10-CM | POA: Diagnosis not present

## 2022-11-26 DIAGNOSIS — E1169 Type 2 diabetes mellitus with other specified complication: Secondary | ICD-10-CM | POA: Diagnosis not present

## 2023-03-20 DIAGNOSIS — J018 Other acute sinusitis: Secondary | ICD-10-CM | POA: Diagnosis not present

## 2023-03-20 DIAGNOSIS — B349 Viral infection, unspecified: Secondary | ICD-10-CM | POA: Diagnosis not present

## 2023-05-02 DIAGNOSIS — G4733 Obstructive sleep apnea (adult) (pediatric): Secondary | ICD-10-CM | POA: Diagnosis not present

## 2023-10-31 ENCOUNTER — Emergency Department (HOSPITAL_COMMUNITY)

## 2023-10-31 ENCOUNTER — Emergency Department (HOSPITAL_COMMUNITY)
Admission: EM | Admit: 2023-10-31 | Discharge: 2023-11-01 | Disposition: A | Discharge status: Home / Self Care | Source: Other Acute Inpatient Hospital | Attending: Emergency Medicine | Admitting: Emergency Medicine

## 2023-10-31 ENCOUNTER — Other Ambulatory Visit: Payer: Self-pay

## 2023-10-31 ENCOUNTER — Encounter (HOSPITAL_COMMUNITY): Payer: Self-pay

## 2023-10-31 DIAGNOSIS — Z7982 Long term (current) use of aspirin: Secondary | ICD-10-CM | POA: Insufficient documentation

## 2023-10-31 DIAGNOSIS — E119 Type 2 diabetes mellitus without complications: Secondary | ICD-10-CM | POA: Insufficient documentation

## 2023-10-31 DIAGNOSIS — E860 Dehydration: Secondary | ICD-10-CM | POA: Insufficient documentation

## 2023-10-31 DIAGNOSIS — Z7984 Long term (current) use of oral hypoglycemic drugs: Secondary | ICD-10-CM | POA: Diagnosis not present

## 2023-10-31 DIAGNOSIS — R109 Unspecified abdominal pain: Secondary | ICD-10-CM | POA: Diagnosis not present

## 2023-10-31 DIAGNOSIS — R059 Cough, unspecified: Secondary | ICD-10-CM | POA: Insufficient documentation

## 2023-10-31 DIAGNOSIS — N179 Acute kidney failure, unspecified: Secondary | ICD-10-CM | POA: Diagnosis not present

## 2023-10-31 DIAGNOSIS — R079 Chest pain, unspecified: Secondary | ICD-10-CM | POA: Diagnosis not present

## 2023-10-31 DIAGNOSIS — R110 Nausea: Secondary | ICD-10-CM | POA: Insufficient documentation

## 2023-10-31 DIAGNOSIS — R1031 Right lower quadrant pain: Secondary | ICD-10-CM | POA: Diagnosis not present

## 2023-10-31 DIAGNOSIS — I10 Essential (primary) hypertension: Secondary | ICD-10-CM | POA: Insufficient documentation

## 2023-10-31 DIAGNOSIS — N281 Cyst of kidney, acquired: Secondary | ICD-10-CM | POA: Diagnosis not present

## 2023-10-31 DIAGNOSIS — R197 Diarrhea, unspecified: Secondary | ICD-10-CM | POA: Diagnosis not present

## 2023-10-31 DIAGNOSIS — Z79899 Other long term (current) drug therapy: Secondary | ICD-10-CM | POA: Diagnosis not present

## 2023-10-31 DIAGNOSIS — R0789 Other chest pain: Secondary | ICD-10-CM | POA: Diagnosis not present

## 2023-10-31 DIAGNOSIS — E876 Hypokalemia: Secondary | ICD-10-CM | POA: Insufficient documentation

## 2023-10-31 DIAGNOSIS — R0602 Shortness of breath: Secondary | ICD-10-CM | POA: Diagnosis not present

## 2023-10-31 DIAGNOSIS — R1084 Generalized abdominal pain: Secondary | ICD-10-CM | POA: Diagnosis not present

## 2023-10-31 DIAGNOSIS — I700 Atherosclerosis of aorta: Secondary | ICD-10-CM | POA: Diagnosis not present

## 2023-10-31 LAB — CBC
HCT: 48.3 % (ref 39.0–52.0)
Hemoglobin: 16.2 g/dL (ref 13.0–17.0)
MCH: 29.5 pg (ref 26.0–34.0)
MCHC: 33.5 g/dL (ref 30.0–36.0)
MCV: 87.8 fL (ref 80.0–100.0)
Platelets: 289 K/uL (ref 150–400)
RBC: 5.5 MIL/uL (ref 4.22–5.81)
RDW: 13.6 % (ref 11.5–15.5)
WBC: 11.4 K/uL — ABNORMAL HIGH (ref 4.0–10.5)
nRBC: 0 % (ref 0.0–0.2)

## 2023-10-31 LAB — BASIC METABOLIC PANEL WITH GFR
Anion gap: 10 (ref 5–15)
BUN: 38 mg/dL — ABNORMAL HIGH (ref 8–23)
CO2: 20 mmol/L — ABNORMAL LOW (ref 22–32)
Calcium: 9.2 mg/dL (ref 8.9–10.3)
Chloride: 107 mmol/L (ref 98–111)
Creatinine, Ser: 1.25 mg/dL — ABNORMAL HIGH (ref 0.61–1.24)
GFR, Estimated: 59 mL/min — ABNORMAL LOW (ref >60)
Glucose, Bld: 214 mg/dL — ABNORMAL HIGH (ref 70–99)
Potassium: 2.9 mmol/L — ABNORMAL LOW (ref 3.5–5.1)
Sodium: 137 mmol/L (ref 135–145)

## 2023-10-31 LAB — LIPASE, BLOOD: Lipase: 30 U/L (ref 11–51)

## 2023-10-31 LAB — HEPATIC FUNCTION PANEL
ALT: 32 U/L (ref 0–44)
AST: 33 U/L (ref 15–41)
Albumin: 3.8 g/dL (ref 3.5–5.0)
Alkaline Phosphatase: 53 U/L (ref 38–126)
Bilirubin, Direct: 0.2 mg/dL (ref 0.0–0.2)
Indirect Bilirubin: 1 mg/dL — ABNORMAL HIGH (ref 0.3–0.9)
Total Bilirubin: 1.2 mg/dL (ref 0.0–1.2)
Total Protein: 7 g/dL (ref 6.5–8.1)

## 2023-10-31 LAB — RESP PANEL BY RT-PCR (RSV, FLU A&B, COVID)  RVPGX2
Influenza A by PCR: NEGATIVE
Influenza B by PCR: NEGATIVE
Resp Syncytial Virus by PCR: NEGATIVE
SARS Coronavirus 2 by RT PCR: NEGATIVE

## 2023-10-31 LAB — MAGNESIUM: Magnesium: 1.5 mg/dL — ABNORMAL LOW (ref 1.7–2.4)

## 2023-10-31 LAB — TROPONIN I (HIGH SENSITIVITY)
Troponin I (High Sensitivity): 15 ng/L (ref <18)
Troponin I (High Sensitivity): 14 ng/L (ref <18)

## 2023-10-31 MED ORDER — IOHEXOL 300 MG/ML  SOLN
100.0000 mL | Freq: Once | INTRAMUSCULAR | Status: AC | PRN
  Administered 2023-10-31: 100 mL via INTRAVENOUS

## 2023-10-31 MED ORDER — LACTATED RINGERS IV BOLUS
1000.0000 mL | Freq: Once | INTRAVENOUS | Status: AC
  Administered 2023-10-31: 1000 mL via INTRAVENOUS

## 2023-10-31 MED ORDER — POTASSIUM CHLORIDE CRYS ER 20 MEQ PO TBCR: 20.0000 meq | EXTENDED_RELEASE_TABLET | Freq: Every day | ORAL | 0 refills | Status: AC

## 2023-10-31 MED ORDER — MAG-OXIDE 200 MG PO TABS: 200.0000 mg | ORAL_TABLET | Freq: Every day | ORAL | 0 refills | Status: AC

## 2023-10-31 MED ORDER — ONDANSETRON 4 MG PO TBDP: 4.0000 mg | ORAL_TABLET | Freq: Three times a day (TID) | ORAL | 0 refills | Status: AC | PRN

## 2023-10-31 MED ORDER — POTASSIUM CHLORIDE CRYS ER 20 MEQ PO TBCR
40.0000 meq | EXTENDED_RELEASE_TABLET | ORAL | Status: AC
  Administered 2023-10-31: 40 meq via ORAL
  Filled 2023-10-31: qty 2

## 2023-10-31 MED ORDER — MAGNESIUM SULFATE 50 % IJ SOLN: 1.0000 g | Freq: Once | INTRAMUSCULAR | Status: DC

## 2023-10-31 MED ORDER — MAGNESIUM OXIDE -MG SUPPLEMENT 400 (240 MG) MG PO TABS
400.0000 mg | ORAL_TABLET | ORAL | Status: AC
  Administered 2023-11-01: 400 mg via ORAL
  Filled 2023-10-31: qty 1

## 2023-10-31 MED ORDER — ONDANSETRON HCL 4 MG/2ML IJ SOLN
4.0000 mg | Freq: Once | INTRAMUSCULAR | Status: AC
  Administered 2023-10-31: 4 mg via INTRAVENOUS
  Filled 2023-10-31: qty 2

## 2023-10-31 NOTE — ED Provider Notes (Signed)
 [place] [facility]ERGENCY DEPARTMENT AT [facility] Provider Note   CSN: [id number] Arrival date & time: 10/31/23  8182     Patient presents with: Chest Pain and Cough   Christopher Hernandez. is a 78 y.o. male.  {Add pertinent medical, surgical, social history, OB history to HPI:3577} 78 year old male with history of hypertension, hyperlipidemia, diabetes, and obesity who presents to the emergency department with abdominal pain, diarrhea, and vomiting.  Patient reports that 6 days ago he started having diarrhea.  Since then started experiencing cramping abdominal pain.  Describes it in his entire abdomen and causes him to belch which he says is led to some discomfort in his chest.  Last bowel movement was this morning.  Unsure if he is passing gas but feels like his abdomen is distended.  Also has had some congestion and cough recently as well.  No fevers.  No abdominal surgeries.       Prior to Admission medications   Medication Sig Start Date End Date Taking? Authorizing Provider  aspirin  81 MG tablet Take 81 mg by mouth daily.    [provider]  fluticasone  (FLONASE ) 50 MCG/ACT nasal spray Place into both nostrils daily.    [provider]  glimepiride (AMARYL) 2 MG tablet Take 2 mg by mouth every morning. 05/23/19   [provider]  lisinopril  (ZESTRIL ) 10 MG tablet Take 10 mg by mouth daily. 05/20/20   [provider]  metFORMIN  (GLUCOPHAGE ) 1000 MG tablet Take 1 tablet (1,000 mg total) by mouth every other day. 01/20/20   Dann Candyce RAMAN, MD  metoprolol succinate (TOPROL-XL) 25 MG 24 hr tablet Take 25 mg by mouth daily. 05/20/20   [provider]  sertraline  (ZOLOFT ) 100 MG tablet Take 150 mg by mouth at bedtime.    [provider]  simvastatin  (ZOCOR ) 40 MG tablet Take 40 mg by mouth every evening.    [provider]    Allergies: Patient has no known allergies.    Review of Systems  Updated Vital  Signs BP 137/78   Pulse 91   Temp 98.4 F (36.9 C)   Resp 18   SpO2 99%   Physical Exam Vitals and nursing note reviewed.  Constitutional:      General: He is not in acute distress.    Appearance: He is well-developed.  HENT:     Head: Normocephalic and atraumatic.     Right Ear: External ear normal.     Left Ear: External ear normal.     Nose: Nose normal.  Eyes:     Extraocular Movements: Extraocular movements intact.     Conjunctiva/sclera: Conjunctivae normal.     Pupils: Pupils are equal, round, and reactive to light.  Cardiovascular:     Rate and Rhythm: Normal rate and regular rhythm.     Heart sounds: Normal heart sounds.  Pulmonary:     Effort: Pulmonary effort is normal. No respiratory distress.     Breath sounds: Normal breath sounds.  Abdominal:     General: There is no distension.     Palpations: Abdomen is soft. There is no mass.     Tenderness: There is abdominal tenderness (Right lower quadrant). There is no guarding.  Musculoskeletal:     Cervical back: Normal range of motion and neck supple.     Right lower leg: No edema.     Left lower leg: No edema.  Skin:    General: Skin is warm and dry.  Neurological:     Mental Status: He is alert. Mental status is at baseline.  Psychiatric:        Mood and Affect: Mood normal.        Behavior: Behavior normal.     (all labs ordered are listed, but only abnormal results are displayed) Labs Reviewed  BASIC METABOLIC PANEL WITH GFR - Abnormal; Notable for the following components:      Result Value   Potassium 2.9 (*)    CO2 20 (*)    Glucose, Bld 214 (*)    BUN 38 (*)    Creatinine, Ser 1.25 (*)    GFR, Estimated 59 (*)    All other components within normal limits  CBC - Abnormal; Notable for the following components:   WBC 11.4 (*)    All other components within normal limits  LIPASE, BLOOD  HEPATIC FUNCTION PANEL  TROPONIN I (HIGH SENSITIVITY)  TROPONIN I (HIGH SENSITIVITY)     EKG: None  Radiology: DG Chest 2 View Result Date: 10/31/2023 CLINICAL DATA:  Chest pain. Abdominal pain. Diffuse abdominal pain. Dry mouth. Nausea, vomiting, diarrhea. Unwell feeling for 1 week. EXAM: CHEST - 2 VIEW COMPARISON:  01/03/2020 FINDINGS: Normal heart size and pulmonary vascularity. No focal airspace disease or consolidation in the lungs. No blunting of costophrenic angles. No pneumothorax. Mediastinal contours appear intact. Calcification of the aorta. Degenerative changes in the spine. Old rib fractures. IMPRESSION: No active cardiopulmonary disease. Electronically Signed   By: Christopher Gravely M.D.   On: 10/31/2023 19:47    {Document cardiac monitor, telemetry assessment procedure when appropriate:32947} Procedures   Medications Ordered in the ED - No data to display    {Click here for ABCD2, HEART and other calculators REFRESH Note before signing:1}                              Medical Decision Making Amount and/or Complexity of Data Reviewed Labs: ordered. Radiology: ordered.  Risk Prescription drug management.   ***  {Document critical care time when appropriate  Document review of labs and clinical decision tools ie CHADS2VASC2, etc  Document your independent review of radiology images and any outside records  Document your discussion with family members, caretakers and with consultants  Document social determinants of health affecting pt's care  Document your decision making why or why not admission, treatments were needed:32947:::1}   Final diagnoses:  None    ED Discharge Orders     None

## 2023-10-31 NOTE — Discharge Instructions (Addendum)
 You were seen for your nausea and abdominal pain in the emergency department.   At home, please take the magnesium  and potassium for your low levels. Stay well hydrated. Take the zofran  for any nausea you have.    Check your MyChart online for the results of any tests that had not resulted by the time you left the emergency department.   Follow-up with your primary doctor in 2-3 days regarding your visit.    Return immediately to the emergency department if you experience any of the following: worsening pain, vomiting despite the medications, or any other concerning symptoms.    Thank you for visiting our Emergency Department. It was a pleasure taking care of you today.

## 2023-10-31 NOTE — ED Triage Notes (Signed)
 Pt has c/o chest pain, cough, runny nose, abdominal distention- sent here from [facility].

## 2023-10-31 NOTE — ED Provider Triage Note (Signed)
 Emergency Medicine Provider Triage Evaluation Note  Christopher Hernandez. , a 78 y.o. male  was evaluated in triage.  Pt complains of abd pain. Diffused abd pain, dry mouth, n/v/d, belching, chest pain, and not feeling well x 1 week.  Sent by PCP  Review of Systems  Positive: As above Negative: As above  Physical Exam  BP (!) 143/69   Pulse 60   Temp 98.4 F (36.9 C)   Resp 18   SpO2 99%  Gen:   Awake, no distress   Resp:  Normal effort  MSK:   Moves extremities without difficulty  Other:    Medical Decision Making  Medically screening exam initiated at 7:08 PM.  Appropriate orders placed.  Christopher Hernandez. was informed that the remainder of the evaluation will be completed by another provider, this initial triage assessment does not replace that evaluation, and the importance of remaining in the ED until their evaluation is complete.     Christopher Colon, PA-C 10/31/23 1909

## 2023-11-09 DIAGNOSIS — J329 Chronic sinusitis, unspecified: Secondary | ICD-10-CM | POA: Diagnosis not present

## 2023-11-09 DIAGNOSIS — E876 Hypokalemia: Secondary | ICD-10-CM | POA: Diagnosis not present

## 2023-11-09 DIAGNOSIS — H6122 Impacted cerumen, left ear: Secondary | ICD-10-CM | POA: Diagnosis not present

## 2023-11-09 DIAGNOSIS — H6091 Unspecified otitis externa, right ear: Secondary | ICD-10-CM | POA: Diagnosis not present

## 2023-11-18 NOTE — Progress Notes (Signed)
 Respiratory viral panel ordered to assess for respiratory infection.

## 2023-12-20 ENCOUNTER — Telehealth: Payer: Self-pay | Admitting: Pharmacist

## 2023-12-20 NOTE — Progress Notes (Signed)
   12/20/2023  Patient ID: Christopher Clem Claudene Mickey., male   DOB: 08/18/1945, 78 y.o.   MRN: [id number]  Concerns about med adherence since last fills were showing up as 7/9 for 30DS from [facility].   Called [facility] and confirmed patient received his medications on 9/12 for 30DS. Would have missed 1 month of medications. No further concerns at this time.    Christopher Hernandez, PharmD [facility]  Phone Number: [phone]

## 2024-01-02 DIAGNOSIS — I5032 Chronic diastolic (congestive) heart failure: Secondary | ICD-10-CM | POA: Diagnosis not present

## 2024-01-02 DIAGNOSIS — M25572 Pain in left ankle and joints of left foot: Secondary | ICD-10-CM | POA: Diagnosis not present

## 2024-01-02 DIAGNOSIS — J309 Allergic rhinitis, unspecified: Secondary | ICD-10-CM | POA: Diagnosis not present

## 2024-01-02 DIAGNOSIS — Z23 Encounter for immunization: Secondary | ICD-10-CM | POA: Diagnosis not present

## 2024-01-02 DIAGNOSIS — I110 Hypertensive heart disease with heart failure: Secondary | ICD-10-CM | POA: Diagnosis not present

## 2024-01-02 DIAGNOSIS — G4730 Sleep apnea, unspecified: Secondary | ICD-10-CM | POA: Diagnosis not present

## 2024-01-02 DIAGNOSIS — E782 Mixed hyperlipidemia: Secondary | ICD-10-CM | POA: Diagnosis not present

## 2024-01-02 DIAGNOSIS — E1169 Type 2 diabetes mellitus with other specified complication: Secondary | ICD-10-CM | POA: Diagnosis not present

## 2024-01-23 DIAGNOSIS — J019 Acute sinusitis, unspecified: Secondary | ICD-10-CM | POA: Diagnosis not present

## 2024-01-23 DIAGNOSIS — R059 Cough, unspecified: Secondary | ICD-10-CM | POA: Diagnosis not present
